# Patient Record
Sex: Female | Born: 1955
Health system: Southern US, Community
[De-identification: ages and names within clinical notes are randomized; demographics above are authoritative.]

## PROBLEM LIST (undated history)

## (undated) DIAGNOSIS — K279 Peptic ulcer, site unspecified, unspecified as acute or chronic, without hemorrhage or perforation: Secondary | ICD-10-CM

## (undated) DIAGNOSIS — I10 Essential (primary) hypertension: Secondary | ICD-10-CM

## (undated) DIAGNOSIS — K760 Fatty (change of) liver, not elsewhere classified: Secondary | ICD-10-CM

## (undated) DIAGNOSIS — C44300 Unspecified malignant neoplasm of skin of unspecified part of face: Secondary | ICD-10-CM

## (undated) DIAGNOSIS — K55069 Acute infarction of intestine, part and extent unspecified: Secondary | ICD-10-CM

## (undated) DIAGNOSIS — I8290 Acute embolism and thrombosis of unspecified vein: Secondary | ICD-10-CM

## (undated) DIAGNOSIS — C55 Malignant neoplasm of uterus, part unspecified: Secondary | ICD-10-CM

## (undated) DIAGNOSIS — I81 Portal vein thrombosis: Secondary | ICD-10-CM

## (undated) DIAGNOSIS — Z780 Asymptomatic menopausal state: Secondary | ICD-10-CM

## (undated) HISTORY — DX: Acute embolism and thrombosis of unspecified vein: I82.90

## (undated) HISTORY — DX: Fatty (change of) liver, not elsewhere classified: K76.0

## (undated) HISTORY — DX: Malignant neoplasm of uterus, part unspecified: C55

## (undated) HISTORY — DX: Peptic ulcer, site unspecified, unspecified as acute or chronic, without hemorrhage or perforation: K27.9

## (undated) HISTORY — DX: Portal vein thrombosis: I81

## (undated) HISTORY — DX: Unspecified malignant neoplasm of skin of unspecified part of face: C44.300

## (undated) HISTORY — DX: Acute infarction of intestine, part and extent unspecified: K55.069

## (undated) HISTORY — DX: Asymptomatic menopausal state: Z78.0

## (undated) HISTORY — PX: APPENDECTOMY: SHX54

---

## 2006-11-06 DIAGNOSIS — I81 Portal vein thrombosis: Secondary | ICD-10-CM

## 2006-11-06 HISTORY — DX: Portal vein thrombosis: I81

## 2006-12-07 HISTORY — PX: OTHER SURGICAL HISTORY: SHX169

## 2007-01-05 DIAGNOSIS — K279 Peptic ulcer, site unspecified, unspecified as acute or chronic, without hemorrhage or perforation: Secondary | ICD-10-CM

## 2007-01-05 HISTORY — DX: Peptic ulcer, site unspecified, unspecified as acute or chronic, without hemorrhage or perforation: K27.9

## 2007-03-01 HISTORY — PX: ESOPHAGOGASTRODUODENOSCOPY: SHX1529

## 2007-11-07 DIAGNOSIS — C44300 Unspecified malignant neoplasm of skin of unspecified part of face: Secondary | ICD-10-CM

## 2007-11-07 HISTORY — DX: Unspecified malignant neoplasm of skin of unspecified part of face: C44.300

## 2008-01-30 ENCOUNTER — Ambulatory Visit: Payer: Self-pay | Admitting: Family Medicine

## 2008-02-03 ENCOUNTER — Encounter: Payer: Self-pay | Admitting: Family Medicine

## 2008-02-24 ENCOUNTER — Encounter: Payer: Self-pay | Admitting: Family Medicine

## 2008-02-24 DIAGNOSIS — Z8711 Personal history of peptic ulcer disease: Secondary | ICD-10-CM | POA: Insufficient documentation

## 2008-02-24 DIAGNOSIS — Z8719 Personal history of other diseases of the digestive system: Secondary | ICD-10-CM

## 2008-02-24 DIAGNOSIS — I82890 Acute embolism and thrombosis of other specified veins: Secondary | ICD-10-CM | POA: Insufficient documentation

## 2008-02-24 DIAGNOSIS — Z86718 Personal history of other venous thrombosis and embolism: Secondary | ICD-10-CM | POA: Insufficient documentation

## 2008-09-03 ENCOUNTER — Ambulatory Visit: Payer: Self-pay | Admitting: Family Medicine

## 2008-09-03 DIAGNOSIS — I10 Essential (primary) hypertension: Secondary | ICD-10-CM | POA: Insufficient documentation

## 2008-09-03 LAB — CONVERTED CEMR LAB
Protein, U semiquant: NEGATIVE
Specific Gravity, Urine: 1.015
Urobilinogen, UA: 0.2

## 2008-09-04 ENCOUNTER — Encounter: Payer: Self-pay | Admitting: Family Medicine

## 2008-09-04 ENCOUNTER — Telehealth: Payer: Self-pay | Admitting: Family Medicine

## 2008-09-17 ENCOUNTER — Encounter: Payer: Self-pay | Admitting: Family Medicine

## 2008-09-17 LAB — CONVERTED CEMR LAB
ALT: 23 units/L (ref 0–35)
Albumin: 4.5 g/dL (ref 3.5–5.2)
Alkaline Phosphatase: 125 units/L — ABNORMAL HIGH (ref 39–117)
CO2: 22 meq/L (ref 19–32)
Glucose, Bld: 107 mg/dL — ABNORMAL HIGH (ref 70–99)
HCT: 50.3 % — ABNORMAL HIGH (ref 36.0–46.0)
Hemoglobin: 16.6 g/dL — ABNORMAL HIGH (ref 12.0–15.0)
MCV: 88.9 fL (ref 78.0–100.0)
Platelets: 419 10*3/uL — ABNORMAL HIGH (ref 150–400)
RBC: 5.66 M/uL — ABNORMAL HIGH (ref 3.87–5.11)
Sodium: 137 meq/L (ref 135–145)
Total Bilirubin: 0.7 mg/dL (ref 0.3–1.2)
Total Protein: 7.9 g/dL (ref 6.0–8.3)
VLDL: 27 mg/dL (ref 0–40)
WBC: 8.3 10*3/uL (ref 4.0–10.5)

## 2008-09-18 ENCOUNTER — Encounter: Payer: Self-pay | Admitting: Family Medicine

## 2008-09-28 ENCOUNTER — Ambulatory Visit: Payer: Self-pay | Admitting: Family Medicine

## 2008-09-28 DIAGNOSIS — E119 Type 2 diabetes mellitus without complications: Secondary | ICD-10-CM | POA: Insufficient documentation

## 2008-09-28 DIAGNOSIS — E785 Hyperlipidemia, unspecified: Secondary | ICD-10-CM | POA: Insufficient documentation

## 2008-10-20 ENCOUNTER — Encounter: Payer: Self-pay | Admitting: Family Medicine

## 2008-10-21 LAB — CONVERTED CEMR LAB
BUN: 12 mg/dL (ref 6–23)
CO2: 21 meq/L (ref 19–32)
Chloride: 103 meq/L (ref 96–112)
Creatinine, Ser: 0.55 mg/dL (ref 0.40–1.20)
Glucose, Bld: 120 mg/dL — ABNORMAL HIGH (ref 70–99)
Potassium: 4.6 meq/L (ref 3.5–5.3)

## 2008-11-06 HISTORY — PX: ABDOMINAL HYSTERECTOMY: SHX81

## 2008-11-16 ENCOUNTER — Telehealth: Payer: Self-pay | Admitting: Family Medicine

## 2008-12-08 ENCOUNTER — Ambulatory Visit: Payer: Self-pay | Admitting: Family Medicine

## 2009-01-26 ENCOUNTER — Ambulatory Visit: Payer: Self-pay | Admitting: Family Medicine

## 2009-01-26 DIAGNOSIS — Z8639 Personal history of other endocrine, nutritional and metabolic disease: Secondary | ICD-10-CM

## 2009-01-26 DIAGNOSIS — Z862 Personal history of diseases of the blood and blood-forming organs and certain disorders involving the immune mechanism: Secondary | ICD-10-CM | POA: Insufficient documentation

## 2009-01-28 ENCOUNTER — Telehealth: Payer: Self-pay | Admitting: Family Medicine

## 2009-01-31 ENCOUNTER — Encounter: Payer: Self-pay | Admitting: Family Medicine

## 2009-02-01 ENCOUNTER — Encounter: Payer: Self-pay | Admitting: Family Medicine

## 2009-02-04 ENCOUNTER — Encounter: Payer: Self-pay | Admitting: Family Medicine

## 2009-02-04 LAB — CONVERTED CEMR LAB
GGT: 82 units/L — ABNORMAL HIGH (ref 7–51)
Glucose, Bld: 101 mg/dL — ABNORMAL HIGH (ref 70–99)
HCT: 45.9 % (ref 36.0–46.0)
HDL: 55 mg/dL (ref >39)
LDL Cholesterol: 116 mg/dL — ABNORMAL HIGH (ref 0–99)
RBC: 5.33 M/uL — ABNORMAL HIGH (ref 3.87–5.11)
VLDL: 20 mg/dL (ref 0–40)
WBC: 7.5 10*3/uL (ref 4.0–10.5)

## 2009-02-08 ENCOUNTER — Telehealth: Payer: Self-pay | Admitting: Family Medicine

## 2009-08-30 ENCOUNTER — Telehealth: Payer: Self-pay | Admitting: Family Medicine

## 2009-08-31 ENCOUNTER — Ambulatory Visit: Payer: Self-pay | Admitting: Family Medicine

## 2009-08-31 DIAGNOSIS — N950 Postmenopausal bleeding: Secondary | ICD-10-CM | POA: Insufficient documentation

## 2009-08-31 LAB — CONVERTED CEMR LAB
Glucose, Urine, Semiquant: NEGATIVE
Nitrite: NEGATIVE
WBC Urine, dipstick: NEGATIVE
pH: 6

## 2009-09-01 ENCOUNTER — Encounter: Payer: Self-pay | Admitting: Family Medicine

## 2009-09-01 LAB — CONVERTED CEMR LAB
Hemoglobin: 16.2 g/dL — ABNORMAL HIGH (ref 12.0–15.0)
Hgb A1c MFr Bld: 6.7 % — ABNORMAL HIGH (ref 4.6–6.1)
Platelets: 389 10*3/uL (ref 150–400)
RBC: 5.47 M/uL — ABNORMAL HIGH (ref 3.87–5.11)
RDW: 14.1 % (ref 11.5–15.5)
WBC, Wet Prep HPF POC: NONE SEEN
WBC: 7.9 10*3/uL (ref 4.0–10.5)
Yeast Wet Prep HPF POC: NONE SEEN

## 2009-11-06 DIAGNOSIS — C55 Malignant neoplasm of uterus, part unspecified: Secondary | ICD-10-CM

## 2009-11-06 HISTORY — DX: Malignant neoplasm of uterus, part unspecified: C55

## 2010-01-04 ENCOUNTER — Ambulatory Visit: Payer: Self-pay | Admitting: Gynecologic Oncology

## 2010-06-16 ENCOUNTER — Telehealth: Payer: Self-pay | Admitting: Family Medicine

## 2010-06-23 ENCOUNTER — Encounter: Payer: Self-pay | Admitting: Family Medicine

## 2010-06-24 LAB — CONVERTED CEMR LAB
AST: 24 units/L (ref 0–37)
BUN: 10 mg/dL (ref 6–23)
CO2: 22 meq/L (ref 19–32)
Cholesterol: 226 mg/dL — ABNORMAL HIGH (ref 0–200)
Glucose, Bld: 100 mg/dL — ABNORMAL HIGH (ref 70–99)
HDL: 48 mg/dL (ref >39)
LDL Cholesterol: 153 mg/dL — ABNORMAL HIGH (ref 0–99)
Potassium: 4.7 meq/L (ref 3.5–5.3)
Sodium: 136 meq/L (ref 135–145)
Total Protein: 7.6 g/dL (ref 6.0–8.3)
VLDL: 25 mg/dL (ref 0–40)

## 2010-06-27 ENCOUNTER — Ambulatory Visit: Payer: Self-pay | Admitting: Family Medicine

## 2010-10-06 ENCOUNTER — Telehealth: Payer: Self-pay | Admitting: Family Medicine

## 2010-11-02 ENCOUNTER — Telehealth: Payer: Self-pay | Admitting: Family Medicine

## 2010-11-02 ENCOUNTER — Encounter
Admission: RE | Admit: 2010-11-02 | Discharge: 2010-11-02 | Payer: Self-pay | Source: Home / Self Care | Attending: Family Medicine | Admitting: Family Medicine

## 2010-12-06 NOTE — Progress Notes (Signed)
Summary: changed statin  Phone Note Call from Patient   Caller: Patient Call For: [place] Bars DO Summary of Call: Pt wanted to let you know that she stopped the cholesterol med because it made her feel funny. Has a followup with you in 6 months. Initial call taken by: Kath[place] LPN,  October 06, 2010 12:00 PM  Follow-up for Phone Call        Since her LDL bad cholesterol was 23 points above goal, I am going to change her Simvastatin to Pravastatin which should be better tolerated.  Take 1 tab at bedtime.  Call if any problems.  Plan to recheck labs 8 wks after new start.   Follow-up by: [place] Bars DO,  October 09, 2010 12:42 PM    New/Updated Medications: PRAVASTATIN SODIUM 20 MG TABS (PRAVASTATIN SODIUM) 1 tab by mouth at bedtime Prescriptions: PRAVASTATIN SODIUM 20 MG TABS (PRAVASTATIN SODIUM) 1 tab by mouth at bedtime  #30 x 1   Entered and Authorized by:   [place] Bars DO   Signed by:   [place] Bars DO on 10/09/2010   Method used:   Electronically to        [facility] [phone]* (retail)       [address] [place], [place]  [address]       Ph: [phone]       Fax: [phone]   RxID:   [id number]   Appended Document: changed statin Pt aware of the above

## 2010-12-06 NOTE — Assessment & Plan Note (Signed)
Summary: CPE w/o pap   Vital Signs:  Patient profile:   55 year old female Menstrual status:  hysterectomy Height:      63 inches Weight:      191 pounds BMI:     33.96 O2 Sat:      96 % on Room air Pulse rate:   93 / minute BP sitting:   124 / 74  (left arm) Cuff size:   large  Vitals Entered By: Payton Spark CMA (June 27, 2010 9:17 AM)  O2 Flow:  Room air CC: CPE w/ out pap     Menstrual Status hysterectomy   Primary Care Provider:  [place] Bars DO  CC:  CPE w/ out pap.  History of Present Illness: 55 yo WF presents for CPE without pap smear.  She reports having a diagnosis of Stage I Uterine Cancer -- had a laparoscopic hysterectomy with oophorectomy in [place] in April.  Doing well.  Was told to f/u with Dr Rito Ehrlich.    She is doing well on BP meds.  Had labs done last wk showing elevated cholesterol.  She had a colonoscopy in 08, due for repeat in 2013. Tdap done 2008.  She is staying active and has a good diet.  She is due for a mammogram and DEXA scan.  She denies fam hx of premature heart dz.    Current Medications (verified): 1)  Benazepril Hcl 10 Mg Tabs (Benazepril Hcl) .Marland Kitchen.. 1 Tab By Mouth Daily  Allergies (verified): No Known Drug Allergies  Past History:  Past Medical History: PUD, EGD 3/08 paps- Dr Rito Ehrlich menopausal since age 55.   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  Past Surgical History: emergency splenectomy 2/08 after spontaneous rupture laparoscopic hysterectomy with oophorectomy for stage I uterine cancer (done in [place]) appy [facility] x 2 EGD 03/01/07- 2 gastric ulcers  GI: Dr Jacinto Reap at [facility]  Family History: Reviewed history from 01/30/2008 and no changes required. mother alive, breast cancer at 25, HTN brother HTN father died at 57,  PUD, blood clots  Social History: Reviewed history from 01/30/2008 and no changes required. Retired Engineer, civil (consulting) from [employer] Married to [employer]. Has 2 grown kids. Never smoked. Denies ETOH. Walks daily. Fair diet.  Review of Systems  The patient denies anorexia, fever, weight loss, weight gain, vision loss, decreased hearing, hoarseness, chest pain, syncope, dyspnea on exertion, peripheral edema, prolonged cough, headaches, hemoptysis, abdominal pain, melena, hematochezia, severe indigestion/heartburn, hematuria, incontinence, genital sores, muscle weakness, suspicious skin lesions, transient blindness, difficulty walking, depression, unusual weight change, abnormal bleeding, enlarged lymph nodes, angioedema, breast masses, and testicular masses.    Physical Exam  General:  alert, well-developed, well-nourished, well-hydrated, and overweight-appearing.   Head:  normocephalic and atraumatic.   Eyes:  PERRLA Ears:  no external deformities.   Nose:  no nasal discharge.   Mouth:  good dentition and pharynx pink and moist.   Neck:  no masses.   Breasts:  No mass, nodules, thickening, tenderness, bulging, retraction, inflamation, nipple discharge or skin changes noted.   Lungs:  Normal respiratory effort, chest expands symmetrically. Lungs are clear to auscultation, no crackles or wheezes. Heart:  Normal rate and regular rhythm. S1 and S2 normal without gallop, murmur, click, rub or other extra sounds. Abdomen:  Bowel sounds positive,abdomen soft and non-tender without masses, organomegaly;  incisional scars well healed. Pulses:  2+ radial and pedal pulses Extremities:  no Le edema Skin:  facial telangiecstasias Cervical Nodes:  No lymphadenopathy noted Psych:  good eye contact, not anxious appearing, and not depressed appearing.     Impression & Recommendations:  Problem # 1:  PHYSICAL EXAMINATION (ICD-V70.0) Keeping healthy checklist for women reviewed. BP at goal on meds, continue. Labs  reviewed from 8-18, cholesterol high.  started Simvastatin 20 mg at bedtime.   Update mammogram and DEXA. Has gyn f/u for newly diagnosed and surgerically removed stage I uterine CA. Tdap done 08. PNX done 08 at time of splenectomy. Colonscopy done 08, due for 5 yr repeat.   Complete Medication List: 1)  Benazepril Hcl 10 Mg Tabs (Benazepril hcl) .Marland Kitchen.. 1 tab by mouth daily 2)  Simvastatin 20 Mg Tabs (Simvastatin) .Marland Kitchen.. 1 tab by mouth at bedtime  Other Orders: T-Mammography Bilateral Screening (16109) T-Dual DXA Bone Density/ Axial (60454) T-DXA Bone Density/ Appendicular (09811)  Patient Instructions: 1)  Start on Simvastatin 20 mg at bedtime for high cholesterol 2)  Check labs in 3 mos. 3)  Update mammogram and DEXA downstairs. 4)  Return for f/u HTN/ cholesterol in 6 mos. Prescriptions: SIMVASTATIN 20 MG TABS (SIMVASTATIN) 1 tab by mouth at bedtime  #30 x 2   Entered and Authorized by:   [place] Bars DO   Signed by:   [place] Bars DO on 06/27/2010   Method used:   Electronically to        [facility] [phone]* (retail)       [address] [place], [place]  [address]       Ph: [phone]       Fax: [phone]   RxID:   [id number]    Tetanus/Td Immunization History:    Tetanus/Td # 1:  Historical (12/07/2006)

## 2010-12-06 NOTE — Progress Notes (Signed)
Summary: Lab order  Phone Note Call from Patient   Caller: Patient Summary of Call: Pt would like to have labs prior to CPE next week. Please order labs and I will fax to lab. Initial call taken by: Payton Spark CMA,  June 16, 2010 11:10 AM  Follow-up for Phone Call        lab order printed.  have drawn 8 hrs fasting. Follow-up by: [place] Bars DO,  June 16, 2010 11:20 AM  New Problems: OTH&UNSPEC ENDOCRN NUTRIT METAB&IMMUNITY D/O (ICD-V77.99) SCREENING FOR LIPOID DISORDERS (ICD-V77.91)   New Problems: OTH&UNSPEC ENDOCRN NUTRIT METAB&IMMUNITY D/O (ICD-V77.99) SCREENING FOR LIPOID DISORDERS (ICD-V77.91)  Appended Document: Lab order Pt aware

## 2010-12-08 NOTE — Progress Notes (Signed)
Summary: Orders  Phone Note Call from Patient Call back at Home Phone [phone]   Caller: Patient Call For: [place] Bars DO Summary of Call: Pt needs order to have her mammogram and bone density done. There is an order in the chart for these but no diagnosis code attached. Need new orders with diagnosis. Fax to [facility] in [place] Initial call taken by: Kathlene November LPN,  November 02, 2010 3:43 PM  Follow-up for Phone Call        We called ad gave code for postmenopause.  Follow-up by: Nani Gasser MD,  November 02, 2010 5:37 PM

## 2011-06-21 ENCOUNTER — Encounter: Payer: Self-pay | Admitting: Family Medicine

## 2011-06-28 ENCOUNTER — Ambulatory Visit (INDEPENDENT_AMBULATORY_CARE_PROVIDER_SITE_OTHER): Payer: BC Managed Care – PPO | Admitting: Family Medicine

## 2011-06-28 ENCOUNTER — Encounter: Payer: Self-pay | Admitting: Family Medicine

## 2011-06-28 DIAGNOSIS — I10 Essential (primary) hypertension: Secondary | ICD-10-CM

## 2011-06-28 DIAGNOSIS — Z13220 Encounter for screening for lipoid disorders: Secondary | ICD-10-CM

## 2011-06-28 MED ORDER — BENAZEPRIL HCL 10 MG PO TABS: 10.0000 mg | ORAL_TABLET | Freq: Every day | ORAL | Status: DC

## 2011-06-28 NOTE — Patient Instructions (Signed)
Stay on lotensin.  RFd today.  Update fasting labs today. Will call you w/ results tomorrow.

## 2011-06-29 ENCOUNTER — Encounter: Payer: Self-pay | Admitting: Family Medicine

## 2011-06-29 ENCOUNTER — Telehealth: Payer: Self-pay | Admitting: Family Medicine

## 2011-06-29 LAB — COMPLETE METABOLIC PANEL WITH GFR
ALT: 31 U/L (ref 0–35)
AST: 23 U/L (ref 0–37)
Albumin: 4.3 g/dL (ref 3.5–5.2)
Alkaline Phosphatase: 90 U/L (ref 39–117)
Chloride: 102 mEq/L (ref 96–112)
Creat: 0.54 mg/dL (ref 0.50–1.10)
Total Bilirubin: 0.5 mg/dL (ref 0.3–1.2)
Total Protein: 7.4 g/dL (ref 6.0–8.3)

## 2011-06-29 LAB — CBC WITH DIFFERENTIAL/PLATELET
Basophils Absolute: 0 10*3/uL (ref 0.0–0.1)
Basophils Relative: 0 % (ref 0–1)
Eosinophils Relative: 3 % (ref 0–5)
Hemoglobin: 15.1 g/dL — ABNORMAL HIGH (ref 12.0–15.0)
Lymphocytes Relative: 30 % (ref 12–46)
MCH: 29.5 pg (ref 26.0–34.0)
MCHC: 32.7 g/dL (ref 30.0–36.0)
Monocytes Relative: 9 % (ref 3–12)
Neutrophils Relative %: 58 % (ref 43–77)

## 2011-06-29 LAB — LIPID PANEL
Cholesterol: 201 mg/dL — ABNORMAL HIGH (ref 0–200)
HDL: 47 mg/dL (ref >39)
Total CHOL/HDL Ratio: 4.3 Ratio

## 2011-06-29 NOTE — Assessment & Plan Note (Signed)
Update fasting labs.  BP at goal.  Continue Lotensin, RFd today.

## 2011-06-29 NOTE — Telephone Encounter (Signed)
Pls let pt know that her cholesterol is a little high with a total of 201, TGs slightly high at 162, LDL at goal at 122 (goal is <130).  Fasting sugar is 108 in the pre diabetic range with normal liver and kidney function and normal blood counts.  Work on [employer], regular exercise.  Repeat in 1 yr.

## 2011-06-29 NOTE — Progress Notes (Signed)
  Subjective:    Patient ID: Rachel Reed, female    DOB: 1956/04/02, 55 y.o.   MRN: [id number]  HPI  55 yo WF here for f/u HTN.  Doing well on lotensin w/o any problems.  Denies CP, DOE, edema, palpitations.  Due for fasting labs and RF.  BP 126/79  Pulse 74  Ht 5\' 3"  (1.6 m)  Wt 194 lb (87.998 kg)  BMI 34.37 kg/m2  SpO2 96%    Review of Systems  Constitutional: Negative for fatigue and unexpected weight change.  Eyes: Negative for visual disturbance.  Respiratory: Negative for shortness of breath.   Cardiovascular: Negative for chest pain, palpitations and leg swelling.  Neurological: Negative for headaches.       Objective:   Physical Exam  Constitutional: She appears well-developed and well-nourished.  HENT:  Mouth/Throat: Oropharynx is clear and moist.  Cardiovascular: Normal rate, regular rhythm and normal heart sounds.   Pulmonary/Chest: Effort normal and breath sounds normal.  Musculoskeletal: She exhibits no edema.  Psychiatric: She has a normal mood and affect.          Assessment & Plan:

## 2011-06-29 NOTE — Telephone Encounter (Signed)
Pt notified of results. KJ LPN 

## 2012-06-21 ENCOUNTER — Ambulatory Visit (INDEPENDENT_AMBULATORY_CARE_PROVIDER_SITE_OTHER): Payer: BC Managed Care – PPO | Admitting: Family Medicine

## 2012-06-21 ENCOUNTER — Encounter: Payer: Self-pay | Admitting: Family Medicine

## 2012-06-21 VITALS — BP 130/83 | HR 68 | Ht 63.0 in | Wt 195.0 lb

## 2012-06-21 DIAGNOSIS — R748 Abnormal levels of other serum enzymes: Secondary | ICD-10-CM

## 2012-06-21 DIAGNOSIS — E785 Hyperlipidemia, unspecified: Secondary | ICD-10-CM

## 2012-06-21 DIAGNOSIS — I10 Essential (primary) hypertension: Secondary | ICD-10-CM

## 2012-06-21 DIAGNOSIS — Z1231 Encounter for screening mammogram for malignant neoplasm of breast: Secondary | ICD-10-CM

## 2012-06-21 MED ORDER — BENAZEPRIL HCL 10 MG PO TABS: 10.0000 mg | ORAL_TABLET | Freq: Every day | ORAL | Status: DC

## 2012-06-21 NOTE — Progress Notes (Signed)
  Subjective:    Patient ID: Rachel Reed, female    DOB: 1956/10/26, 56 y.o.   MRN: [id number]  HPI HTN- No CP or SOB. No SE.  Taking meds regularly.  Eating a low salt diet.  Walks her dog twice a day.    Review of Systems     Objective:   Physical Exam  Constitutional: She is oriented to person, place, and time. She appears well-developed and well-nourished.  HENT:  Head: Normocephalic and atraumatic.  Cardiovascular: Normal rate, regular rhythm and normal heart sounds.        No carotid or abdominal bruits.   Pulmonary/Chest: Effort normal and breath sounds normal.  Neurological: She is alert and oriented to person, place, and time.  Skin: Skin is warm and dry.  Psychiatric: She has a normal mood and affect. Her behavior is normal.          Assessment & Plan:  HTN - Well controlled. Refill meds.  Due for labwork. Due for CMP, lipids. Keep up healthy diet and regular exercise. Followup in 6 months.  Due for mammo. Will place order. Pt prefers ot go every 2 years. She is low risk.    Abnormal liver function - Recheck LFTs.

## 2012-09-13 IMAGING — OT DG DXA BONE DENSITY STUDY HL7
2 series · 2 of 2 positions shown · non-contrast
Comparison: None

CLINICAL DATA: Baseline bone mineral density testing.  The patient
is 54 years although and post menopausal.

[Series 1: — · left · 1 of 1 slices shown (1 of 2)]
[im 1/1]
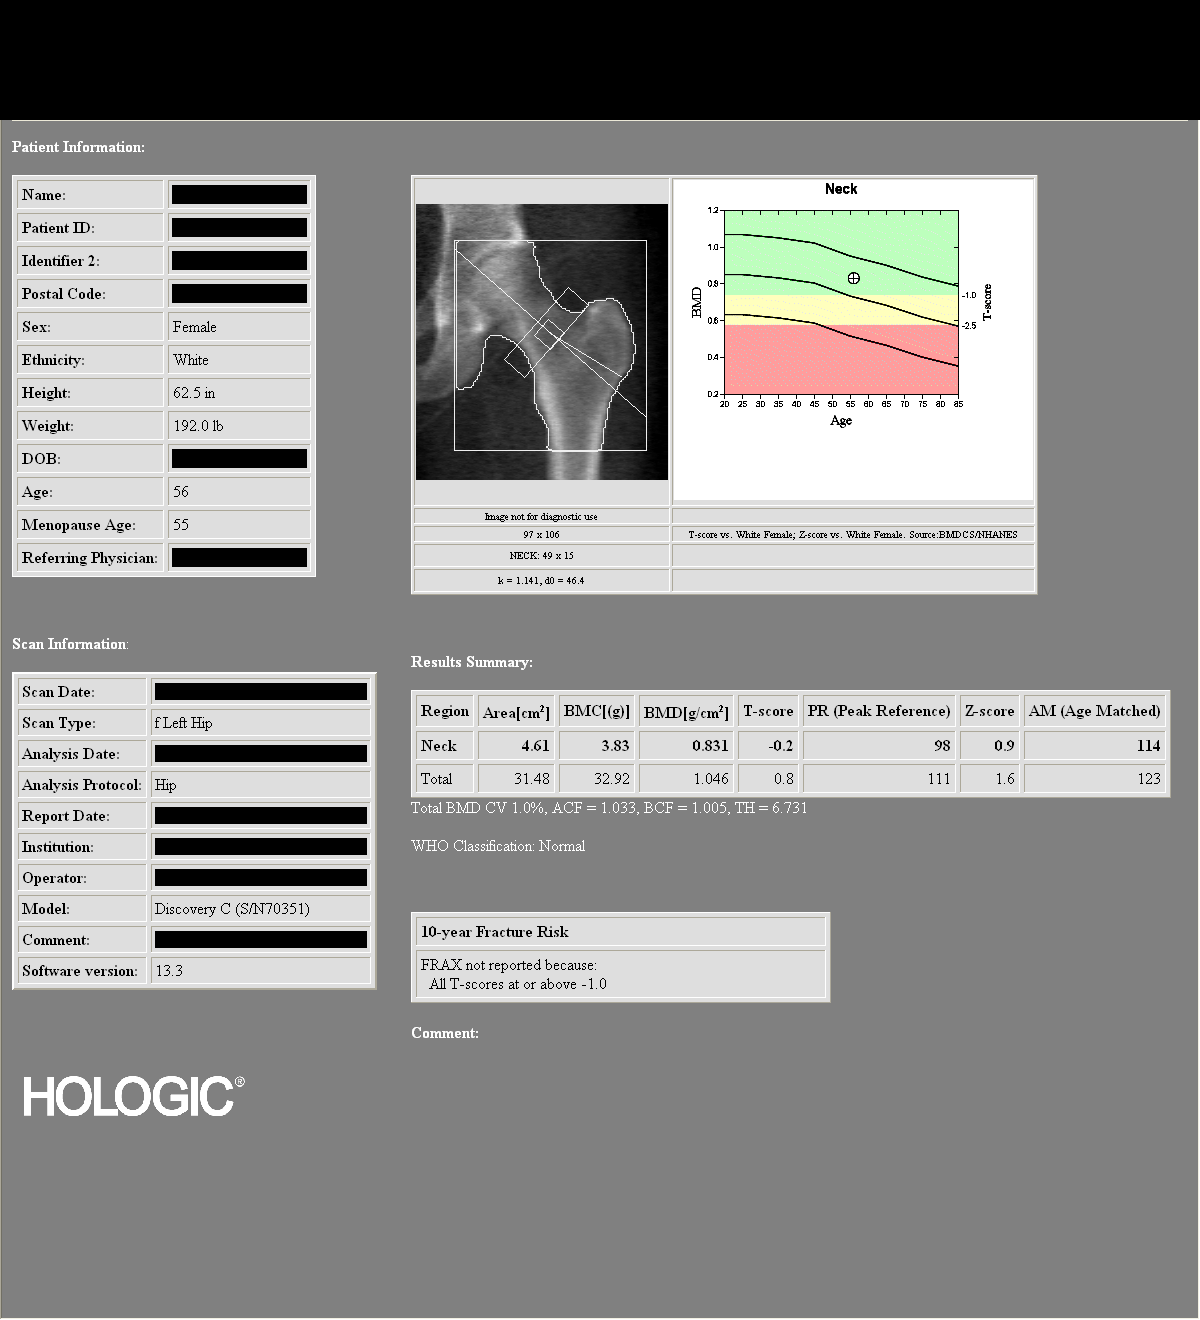

[Series 2: — · 1 of 1 slices shown (2 of 2)]
[im 1/1]
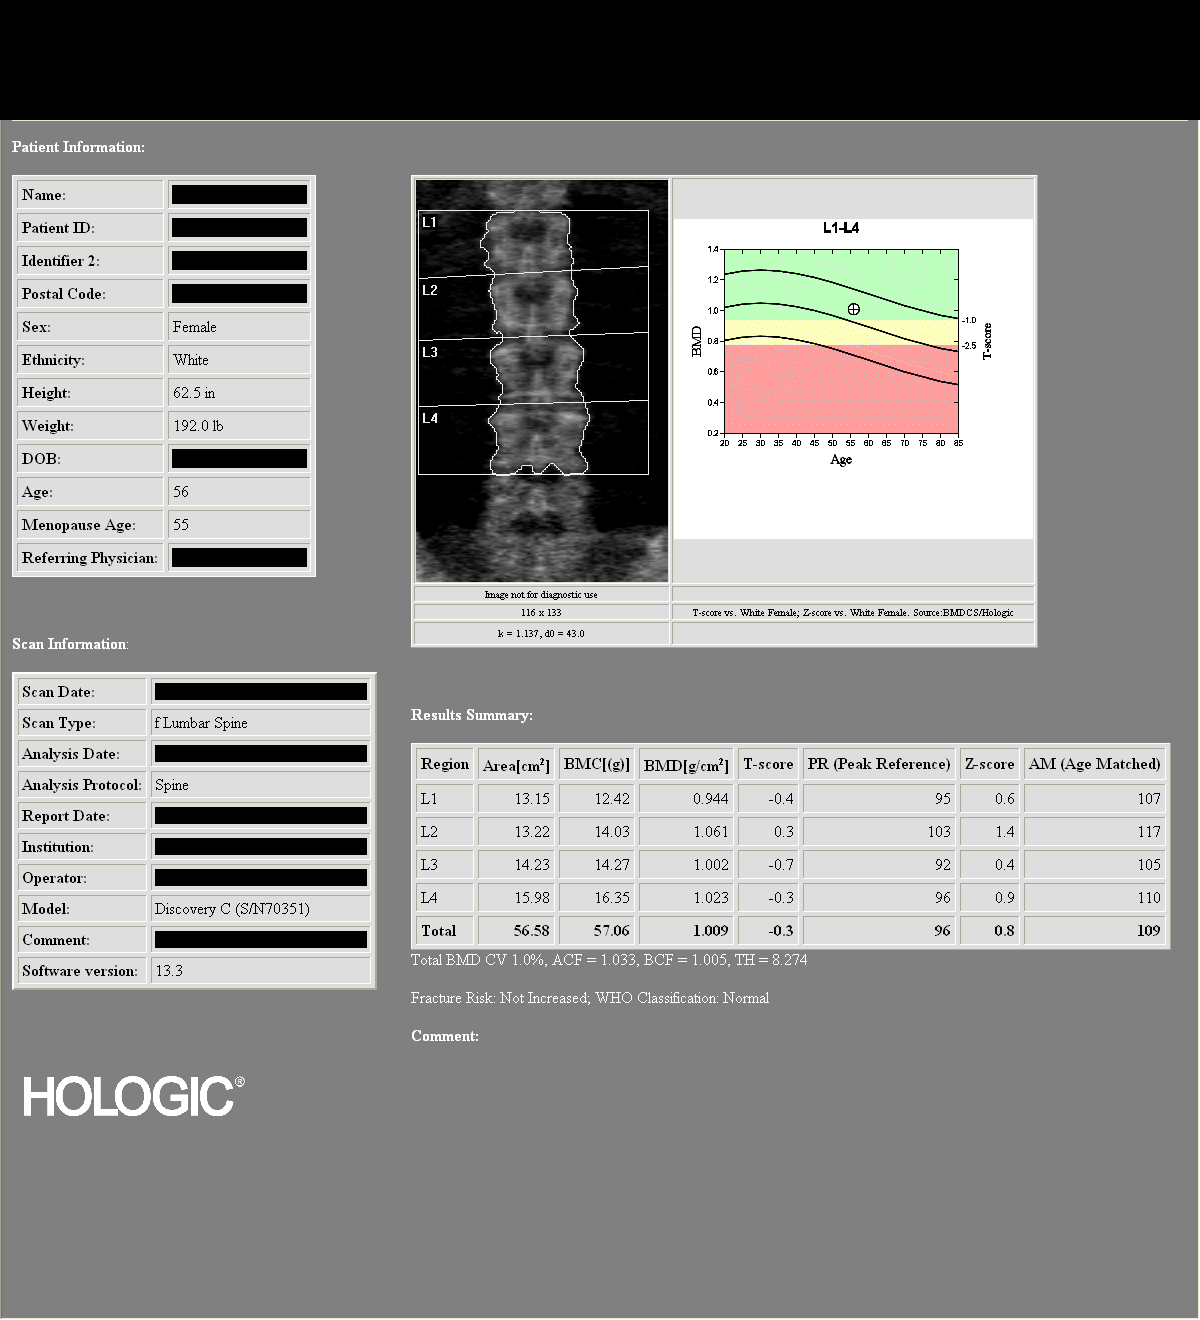

[2 of 2 positions shown; findings below may reference images not displayed]

DUAL X-RAY ABSORPTIOMETRY (DXA) FOR BONE MINERAL DENSITY

AP LUMBAR SPINE L1-L4

Bone Mineral Density (BMD):            1.009 g/cm2
Young Adult T Score:                          -0.3
Z Score:

LEFT FEMUR NECK

Bone Mineral Density (BMD):             0.831 g/cm2
Young Adult T Score:                           -0.2
Z Score:

ASSESSMENT:  Patient's diagnostic category is NORMAL by WHO
Criteria.

FRACTURE RISK: NOT INCREASED
RECOMMENDATIONS:

Effective therapies are available in the form of bisphosphonates,
selective estrogen receptor modulators, biologic agents, and
hormone replacement therapy (for women).  All patients should
ensure an adequate intake of dietary calcium (1200mg daily) and
vitamin D (800 [address]ina Rampersad) unless contraindicated.

All treatment decisions require clinical judgement and
consideration of individual patient factors, including patient
preferences, co-morbidities, previous drug use, risk factors not
captured in the FRAX model (e.g., frailty, falls, vitamin D
deficiency, increased bone turnover, interval significant decline
in bone density) and possible under-or over-estimation of fracture
risk by FRAX.

The National Osteoporosis Foundation recommends that FDA-approved
medical therapies be considered in postmenopausal women and mean
age 50 or older with a:

      1)     Hip or vertebral (clinical or morphometric) fracture.

2)    T-score of -2.5 or lower at the spine or hip.
3)    Ten-year fracture probability by FRAX of 3% or greater for
hip fracture or 20% or greater for major osteoporotic fracture.
FOLLOW-UP:

People with diagnosed cases of osteoporosis or at high risk for
fracture should have regular bone mineral density tests.  For
patients eligible for Medicare, routine testing is allowed once
every 2 years.  The testing frequency can be increased to one year
for patients who have rapidly progressing disease, those who are
receiving or discontinuing medical therapy to restore bone mass, or
have additional risk factors.

World Health Organization (WHO) Criteria:

Normal: T scores from +1.0 to -1.0
Low Bone Mass (Osteopenia): T scores between -1.0 and -2.5
Osteoporosis: T scores -2.5 and below

Comparison to Reference Population:

T score is the key measure used in the diagnosis of osteoporosis
and relative risk determination for fracture.  It provides a value
for bone mass relative to the mean bone mass of a young adult
reference population expressed in terms of standard deviation (SD).

Z score is the age-matched score showing the patient's values
compared to a population matched for age, sex, and race.  This is
also expressed in terms of standard deviation.  The patient may
have values that compare favorably to the age-matched values and
still be at increased risk for fracture.

## 2012-10-09 ENCOUNTER — Encounter: Payer: Self-pay | Admitting: Family Medicine

## 2012-10-09 ENCOUNTER — Telehealth: Payer: Self-pay | Admitting: *Deleted

## 2012-10-09 LAB — COMPLETE METABOLIC PANEL WITH GFR
ALT: 25 U/L (ref 0–35)
AST: 19 U/L (ref 0–37)
Albumin: 4.3 g/dL (ref 3.5–5.2)
Alkaline Phosphatase: 91 U/L (ref 39–117)
BUN: 11 mg/dL (ref 6–23)
CO2: 25 mEq/L (ref 19–32)
Chloride: 102 mEq/L (ref 96–112)
GFR, Est African American: >89 mL/min
GFR, Est Non African American: >89 mL/min

## 2012-10-09 LAB — LIPID PANEL
HDL: 52 mg/dL (ref >39)
LDL Cholesterol: 134 mg/dL — ABNORMAL HIGH (ref 0–99)
Total CHOL/HDL Ratio: 4.3 Ratio

## 2012-10-09 NOTE — Telephone Encounter (Signed)
NOTIFED

## 2012-12-23 ENCOUNTER — Ambulatory Visit: Payer: BC Managed Care – PPO | Admitting: Family Medicine

## 2013-06-23 ENCOUNTER — Ambulatory Visit (INDEPENDENT_AMBULATORY_CARE_PROVIDER_SITE_OTHER): Payer: BC Managed Care – PPO | Admitting: Family Medicine

## 2013-06-23 ENCOUNTER — Encounter: Payer: Self-pay | Admitting: Family Medicine

## 2013-06-23 VITALS — BP 132/82 | HR 78 | Temp 97.5°F | Ht 62.75 in | Wt 190.0 lb

## 2013-06-23 DIAGNOSIS — Z23 Encounter for immunization: Secondary | ICD-10-CM

## 2013-06-23 DIAGNOSIS — E669 Obesity, unspecified: Secondary | ICD-10-CM | POA: Insufficient documentation

## 2013-06-23 DIAGNOSIS — I10 Essential (primary) hypertension: Secondary | ICD-10-CM

## 2013-06-23 DIAGNOSIS — E785 Hyperlipidemia, unspecified: Secondary | ICD-10-CM

## 2013-06-23 DIAGNOSIS — Z0000 Encounter for general adult medical examination without abnormal findings: Secondary | ICD-10-CM

## 2013-06-23 DIAGNOSIS — E2839 Other primary ovarian failure: Secondary | ICD-10-CM

## 2013-06-23 LAB — MICROALBUMIN / CREATININE URINE RATIO: Creatinine,U: 125.3 mg/dL

## 2013-06-23 MED ORDER — BENAZEPRIL HCL 10 MG PO TABS: 10.0000 mg | ORAL_TABLET | Freq: Every day | ORAL | Status: DC

## 2013-06-23 NOTE — Patient Instructions (Addendum)
Preventive Care for Adults, Female A healthy lifestyle and preventive care can promote health and wellness. Preventive health guidelines for women include the following key practices.  A routine yearly physical is a good way to check with your caregiver about your health and preventive screening. It is a chance to share any concerns and updates on your health, and to receive a thorough exam.  Visit your dentist for a routine exam and preventive care every 6 months. Brush your teeth twice a day and floss once a day. Good oral hygiene prevents tooth decay and gum disease.  The frequency of eye exams is based on your age, health, family medical history, use of contact lenses, and other factors. Follow your caregiver's recommendations for frequency of eye exams.  Eat a healthy diet. Foods like vegetables, fruits, whole grains, low-fat dairy products, and lean protein foods contain the nutrients you need without too many calories. Decrease your intake of foods high in solid fats, added sugars, and salt. Eat the right amount of calories for you.Get information about a proper diet from your caregiver, if necessary.  Regular physical exercise is one of the most important things you can do for your health. Most adults should get at least 150 minutes of moderate-intensity exercise (any activity that increases your heart rate and causes you to sweat) each week. In addition, most adults need muscle-strengthening exercises on 2 or more days a week.  Maintain a healthy weight. The body mass index (BMI) is a screening tool to identify possible weight problems. It provides an estimate of body fat based on height and weight. Your caregiver can help determine your BMI, and can help you achieve or maintain a healthy weight.For adults 20 years and older:  A BMI below 18.5 is considered underweight.  A BMI of 18.5 to 24.9 is normal.  A BMI of 25 to 29.9 is considered overweight.  A BMI of 30 and above is  considered obese.  Maintain normal blood lipids and cholesterol levels by exercising and minimizing your intake of saturated fat. Eat a balanced diet with plenty of fruit and vegetables. Blood tests for lipids and cholesterol should begin at age 20 and be repeated every 5 years. If your lipid or cholesterol levels are high, you are over 50, or you are at high risk for heart disease, you may need your cholesterol levels checked more frequently.Ongoing high lipid and cholesterol levels should be treated with medicines if diet and exercise are not effective.  If you smoke, find out from your caregiver how to quit. If you do not use tobacco, do not start.  If you are pregnant, do not drink alcohol. If you are breastfeeding, be very cautious about drinking alcohol. If you are not pregnant and choose to drink alcohol, do not exceed 1 drink per day. One drink is considered to be 12 ounces (355 mL) of beer, 5 ounces (148 mL) of wine, or 1.5 ounces (44 mL) of liquor.  Avoid use of street drugs. Do not share needles with anyone. Ask for help if you need support or instructions about stopping the use of drugs.  High blood pressure causes heart disease and increases the risk of stroke. Your blood pressure should be checked at least every 1 to 2 years. Ongoing high blood pressure should be treated with medicines if weight loss and exercise are not effective.  If you are 55 to 59 years old, ask your caregiver if you should take aspirin to prevent strokes.  Diabetes   screening involves taking a blood sample to check your fasting blood sugar level. This should be done once every 3 years, after age 45, if you are within normal weight and without risk factors for diabetes. Testing should be considered at a younger age or be carried out more frequently if you are overweight and have at least 1 risk factor for diabetes.  Breast cancer screening is essential preventive care for women. You should practice "breast  self-awareness." This means understanding the normal appearance and feel of your breasts and may include breast self-examination. Any changes detected, no matter how small, should be reported to a caregiver. Women in their 20s and 30s should have a clinical breast exam (CBE) by a caregiver as part of a regular health exam every 1 to 3 years. After age 40, women should have a CBE every year. Starting at age 40, women should consider having a mammography (breast X-ray test) every year. Women who have a family history of breast cancer should talk to their caregiver about genetic screening. Women at a high risk of breast cancer should talk to their caregivers about having magnetic resonance imaging (MRI) and a mammography every year.  The Pap test is a screening test for cervical cancer. A Pap test can show cell changes on the cervix that might become cervical cancer if left untreated. A Pap test is a procedure in which cells are obtained and examined from the lower end of the uterus (cervix).  Women should have a Pap test starting at age 21.  Between ages 21 and 29, Pap tests should be repeated every 2 years.  Beginning at age 30, you should have a Pap test every 3 years as long as the past 3 Pap tests have been normal.  Some women have medical problems that increase the chance of getting cervical cancer. Talk to your caregiver about these problems. It is especially important to talk to your caregiver if a new problem develops soon after your last Pap test. In these cases, your caregiver may recommend more frequent screening and Pap tests.  The above recommendations are the same for women who have or have not gotten the vaccine for human papillomavirus (HPV).  If you had a hysterectomy for a problem that was not cancer or a condition that could lead to cancer, then you no longer need Pap tests. Even if you no longer need a Pap test, a regular exam is a good idea to make sure no other problems are  starting.  If you are between ages 65 and 70, and you have had normal Pap tests going back 10 years, you no longer need Pap tests. Even if you no longer need a Pap test, a regular exam is a good idea to make sure no other problems are starting.  If you have had past treatment for cervical cancer or a condition that could lead to cancer, you need Pap tests and screening for cancer for at least 20 years after your treatment.  If Pap tests have been discontinued, risk factors (such as a new sexual partner) need to be reassessed to determine if screening should be resumed.  The HPV test is an additional test that may be used for cervical cancer screening. The HPV test looks for the virus that can cause the cell changes on the cervix. The cells collected during the Pap test can be tested for HPV. The HPV test could be used to screen women aged 30 years and older, and should   be used in women of any age who have unclear Pap test results. After the age of 30, women should have HPV testing at the same frequency as a Pap test.  Colorectal cancer can be detected and often prevented. Most routine colorectal cancer screening begins at the age of 50 and continues through age 75. However, your caregiver may recommend screening at an earlier age if you have risk factors for colon cancer. On a yearly basis, your caregiver may provide home test kits to check for hidden blood in the stool. Use of a small camera at the end of a tube, to directly examine the colon (sigmoidoscopy or colonoscopy), can detect the earliest forms of colorectal cancer. Talk to your caregiver about this at age 50, when routine screening begins. Direct examination of the colon should be repeated every 5 to 10 years through age 75, unless early forms of pre-cancerous polyps or small growths are found.  Hepatitis C blood testing is recommended for all people born from 1945 through 1965 and any individual with known risks for hepatitis C.  Practice  safe sex. Use condoms and avoid high-risk sexual practices to reduce the spread of sexually transmitted infections (STIs). STIs include gonorrhea, chlamydia, syphilis, trichomonas, herpes, HPV, and human immunodeficiency virus (HIV). Herpes, HIV, and HPV are viral illnesses that have no cure. They can result in disability, cancer, and death. Sexually active women aged 25 and younger should be checked for chlamydia. Older women with new or multiple partners should also be tested for chlamydia. Testing for other STIs is recommended if you are sexually active and at increased risk.  Osteoporosis is a disease in which the bones lose minerals and strength with aging. This can result in serious bone fractures. The risk of osteoporosis can be identified using a bone density scan. Women ages 65 and over and women at risk for fractures or osteoporosis should discuss screening with their caregivers. Ask your caregiver whether you should take a calcium supplement or vitamin D to reduce the rate of osteoporosis.  Menopause can be associated with physical symptoms and risks. Hormone replacement therapy is available to decrease symptoms and risks. You should talk to your caregiver about whether hormone replacement therapy is right for you.  Use sunscreen with sun protection factor (SPF) of 30 or more. Apply sunscreen liberally and repeatedly throughout the day. You should seek shade when your shadow is shorter than you. Protect yourself by wearing long sleeves, pants, a wide-brimmed hat, and sunglasses year round, whenever you are outdoors.  Once a month, do a whole body skin exam, using a mirror to look at the skin on your back. Notify your caregiver of new moles, moles that have irregular borders, moles that are larger than a pencil eraser, or moles that have changed in shape or color.  Stay current with required immunizations.  Influenza. You need a dose every fall (or winter). The composition of the flu vaccine  changes each year, so being vaccinated once is not enough.  Pneumococcal polysaccharide. You need 1 to 2 doses if you smoke cigarettes or if you have certain chronic medical conditions. You need 1 dose at age 65 (or older) if you have never been vaccinated.  Tetanus, diphtheria, pertussis (Tdap, Td). Get 1 dose of Tdap vaccine if you are younger than age 65, are over 65 and have contact with an infant, are a healthcare worker, are pregnant, or simply want to be protected from whooping cough. After that, you need a Td   booster dose every 10 years. Consult your caregiver if you have not had at least 3 tetanus and diphtheria-containing shots sometime in your life or have a deep or dirty wound.  HPV. You need this vaccine if you are a woman age 26 or younger. The vaccine is given in 3 doses over 6 months.  Measles, mumps, rubella (MMR). You need at least 1 dose of MMR if you were born in 1957 or later. You may also need a second dose.  Meningococcal. If you are age 19 to 21 and a first-year college student living in a residence hall, or have one of several medical conditions, you need to get vaccinated against meningococcal disease. You may also need additional booster doses.  Zoster (shingles). If you are age 60 or older, you should get this vaccine.  Varicella (chickenpox). If you have never had chickenpox or you were vaccinated but received only 1 dose, talk to your caregiver to find out if you need this vaccine.  Hepatitis A. You need this vaccine if you have a specific risk factor for hepatitis A virus infection or you simply wish to be protected from this disease. The vaccine is usually given as 2 doses, 6 to 18 months apart.  Hepatitis B. You need this vaccine if you have a specific risk factor for hepatitis B virus infection or you simply wish to be protected from this disease. The vaccine is given in 3 doses, usually over 6 months. Preventive Services / Frequency Ages 19 to 39  Blood  pressure check.** / Every 1 to 2 years.  Lipid and cholesterol check.** / Every 5 years beginning at age 20.  Clinical breast exam.** / Every 3 years for women in their 20s and 30s.  Pap test.** / Every 2 years from ages 21 through 29. Every 3 years starting at age 30 through age 65 or 70 with a history of 3 consecutive normal Pap tests.  HPV screening.** / Every 3 years from ages 30 through ages 65 to 70 with a history of 3 consecutive normal Pap tests.  Hepatitis C blood test.** / For any individual with known risks for hepatitis C.  Skin self-exam. / Monthly.  Influenza immunization.** / Every year.  Pneumococcal polysaccharide immunization.** / 1 to 2 doses if you smoke cigarettes or if you have certain chronic medical conditions.  Tetanus, diphtheria, pertussis (Tdap, Td) immunization. / A one-time dose of Tdap vaccine. After that, you need a Td booster dose every 10 years.  HPV immunization. / 3 doses over 6 months, if you are 26 and younger.  Measles, mumps, rubella (MMR) immunization. / You need at least 1 dose of MMR if you were born in 1957 or later. You may also need a second dose.  Meningococcal immunization. / 1 dose if you are age 19 to 21 and a first-year college student living in a residence hall, or have one of several medical conditions, you need to get vaccinated against meningococcal disease. You may also need additional booster doses.  Varicella immunization.** / Consult your caregiver.  Hepatitis A immunization.** / Consult your caregiver. 2 doses, 6 to 18 months apart.  Hepatitis B immunization.** / Consult your caregiver. 3 doses usually over 6 months. Ages 40 to 64  Blood pressure check.** / Every 1 to 2 years.  Lipid and cholesterol check.** / Every 5 years beginning at age 20.  Clinical breast exam.** / Every year after age 40.  Mammogram.** / Every year beginning at age 40   and continuing for as long as you are in good health. Consult with your  caregiver.  Pap test.** / Every 3 years starting at age 30 through age 65 or 70 with a history of 3 consecutive normal Pap tests.  HPV screening.** / Every 3 years from ages 30 through ages 65 to 70 with a history of 3 consecutive normal Pap tests.  Fecal occult blood test (FOBT) of stool. / Every year beginning at age 50 and continuing until age 75. You may not need to do this test if you get a colonoscopy every 10 years.  Flexible sigmoidoscopy or colonoscopy.** / Every 5 years for a flexible sigmoidoscopy or every 10 years for a colonoscopy beginning at age 50 and continuing until age 75.  Hepatitis C blood test.** / For all people born from 1945 through 1965 and any individual with known risks for hepatitis C.  Skin self-exam. / Monthly.  Influenza immunization.** / Every year.  Pneumococcal polysaccharide immunization.** / 1 to 2 doses if you smoke cigarettes or if you have certain chronic medical conditions.  Tetanus, diphtheria, pertussis (Tdap, Td) immunization.** / A one-time dose of Tdap vaccine. After that, you need a Td booster dose every 10 years.  Measles, mumps, rubella (MMR) immunization. / You need at least 1 dose of MMR if you were born in 1957 or later. You may also need a second dose.  Varicella immunization.** / Consult your caregiver.  Meningococcal immunization.** / Consult your caregiver.  Hepatitis A immunization.** / Consult your caregiver. 2 doses, 6 to 18 months apart.  Hepatitis B immunization.** / Consult your caregiver. 3 doses, usually over 6 months. Ages 65 and over  Blood pressure check.** / Every 1 to 2 years.  Lipid and cholesterol check.** / Every 5 years beginning at age 20.  Clinical breast exam.** / Every year after age 40.  Mammogram.** / Every year beginning at age 40 and continuing for as long as you are in good health. Consult with your caregiver.  Pap test.** / Every 3 years starting at age 30 through age 65 or 70 with a 3  consecutive normal Pap tests. Testing can be stopped between 65 and 70 with 3 consecutive normal Pap tests and no abnormal Pap or HPV tests in the past 10 years.  HPV screening.** / Every 3 years from ages 30 through ages 65 or 70 with a history of 3 consecutive normal Pap tests. Testing can be stopped between 65 and 70 with 3 consecutive normal Pap tests and no abnormal Pap or HPV tests in the past 10 years.  Fecal occult blood test (FOBT) of stool. / Every year beginning at age 50 and continuing until age 75. You may not need to do this test if you get a colonoscopy every 10 years.  Flexible sigmoidoscopy or colonoscopy.** / Every 5 years for a flexible sigmoidoscopy or every 10 years for a colonoscopy beginning at age 50 and continuing until age 75.  Hepatitis C blood test.** / For all people born from 1945 through 1965 and any individual with known risks for hepatitis C.  Osteoporosis screening.** / A one-time screening for women ages 65 and over and women at risk for fractures or osteoporosis.  Skin self-exam. / Monthly.  Influenza immunization.** / Every year.  Pneumococcal polysaccharide immunization.** / 1 dose at age 65 (or older) if you have never been vaccinated.  Tetanus, diphtheria, pertussis (Tdap, Td) immunization. / A one-time dose of Tdap vaccine if you are over   65 and have contact with an infant, are a healthcare worker, or simply want to be protected from whooping cough. After that, you need a Td booster dose every 10 years.  Varicella immunization.** / Consult your caregiver.  Meningococcal immunization.** / Consult your caregiver.  Hepatitis A immunization.** / Consult your caregiver. 2 doses, 6 to 18 months apart.  Hepatitis B immunization.** / Check with your caregiver. 3 doses, usually over 6 months. ** Family history and personal history of risk and conditions may change your caregiver's recommendations. Document Released: 12/19/2001 Document Revised: 01/15/2012  Document Reviewed: 03/20/2011 [employer] Patient Information 2014 [employer].  

## 2013-06-23 NOTE — Progress Notes (Signed)
Subjective:     Rachel Reed is a 57 y.o. female and is here for a comprehensive physical exam. The patient reports no problems.  History   Social History  . Marital Status: Married    Spouse Name: N/A    Number of Children: 2  . Years of Education: N/A   Occupational History  . retired Engineer, civil (consulting)    Social History Main Topics  . Smoking status: Never Smoker   . Smokeless tobacco: Not on file  . Alcohol Use: No  . Drug Use: No  . Sexual Activity: Yes    Partners: Male   Other Topics Concern  . Not on file   Social History Narrative   Exercise-- play golf, walk    Health Maintenance  Topic Date Due  . Mammogram  11/02/2012  . Influenza Vaccine  07/07/2013  . Colonoscopy  11/06/2016  . Tetanus/tdap  12/07/2016    The following portions of the patient's history were reviewed and updated as appropriate:  She  has a past medical history of PUD (peptic ulcer disease) (01/2007); Menopause (age 50); Fatty liver; Thrombosis mesenteric vessel; Thrombosis; Cancer (2011); and Skin cancer of face (2009). She  does not have any pertinent problems on file. She  has past surgical history that includes spleenectomy (12/2006); Appendectomy; Cesarean section; Esophagogastroduodenoscopy (03/01/2007); and Abdominal hysterectomy (2010). Her family history includes Breast cancer (age of onset: 61) in her mother; Diabetes in her paternal grandmother and paternal uncle; Hyperlipidemia in her mother; Hypertension in her brother and mother; Pulmonary embolism (age of onset: 46) in her father; Ulcers in her father. She  reports that she has never smoked. She does not have any smokeless tobacco history on file. She reports that she does not drink alcohol or use illicit drugs. She has a current medication list which includes the following prescription(s): benazepril. No current outpatient prescriptions on file prior to visit.   No current facility-administered medications on file prior to visit.   She  has No Known Allergies..  Review of Systems Review of Systems  Constitutional: Negative for activity change, appetite change and fatigue.  HENT: Negative for hearing loss, congestion, tinnitus and ear discharge.  dentist q78m Eyes: Negative for visual disturbance (see optho q1y -- vision corrected to 20/20 with glasses).  Respiratory: Negative for cough, chest tightness and shortness of breath.   Cardiovascular: Negative for chest pain, palpitations and leg swelling.  Gastrointestinal: Negative for abdominal pain, diarrhea, constipation and abdominal distention.  Genitourinary: Negative for urgency, frequency, decreased urine volume and difficulty urinating.  Musculoskeletal: Negative for back pain, arthralgias and gait problem.  Skin: Negative for color change, pallor and rash.  Neurological: Negative for dizziness, light-headedness, numbness and headaches.  Hematological: Negative for adenopathy. Does not bruise/bleed easily.  Psychiatric/Behavioral: Negative for suicidal ideas, confusion, sleep disturbance, self-injury, dysphoric mood, decreased concentration and agitation.       Objective:    BP 132/82  Pulse 78  Temp(Src) 97.5 F (36.4 C) (Oral)  Ht 5' 2.75" (1.594 m)  Wt 190 lb (86.183 kg)  BMI 33.92 kg/m2  SpO2 98% General appearance: alert, cooperative, appears stated age and no distress Head: Normocephalic, without obvious abnormality, atraumatic Eyes: negative findings: lids and lashes normal, conjunctivae and sclerae normal and pupils equal, round, reactive to light and accomodation Ears: normal TM's and external ear canals both ears Nose: Nares normal. Septum midline. Mucosa normal. No drainage or sinus tenderness. Throat: lips, mucosa, and tongue normal; teeth and gums normal Neck: no adenopathy,  no carotid bruit, no JVD, supple, symmetrical, trachea midline and thyroid not enlarged, symmetric, no tenderness/mass/nodules Back: symmetric, no curvature. ROM normal. No  CVA tenderness. Lungs: clear to auscultation bilaterally Breasts: normal appearance, no masses or tenderness Heart: regular rate and rhythm, S1, S2 normal, no murmur, click, rub or gallop Abdomen: soft, non-tender; bowel sounds normal; no masses,  no organomegaly Pelvic: not indicated; status post hysterectomy, negative ROS Extremities: extremities normal, atraumatic, no cyanosis or edema Pulses: 2+ and symmetric Skin: Skin color, texture, turgor normal. No rashes or lesions Lymph nodes: Cervical, supraclavicular, and axillary nodes normal. Neurologic: Alert and oriented X 3, normal strength and tone. Normal symmetric reflexes. Normal coordination and gait Psych-- no anxiety, no depression      Assessment:    Healthy female exam.      Plan:  Check labs  ghm utd See After Visit Summary for Counseling Recommendations

## 2013-06-23 NOTE — Assessment & Plan Note (Signed)
con't meds  Check labs 

## 2013-06-23 NOTE — Assessment & Plan Note (Signed)
Stable Lower at home con't meds

## 2013-06-24 LAB — HEPATIC FUNCTION PANEL
AST: 27 U/L (ref 0–37)
Albumin: 4.2 g/dL (ref 3.5–5.2)
Bilirubin, Direct: 0.1 mg/dL (ref 0.0–0.3)
Total Bilirubin: 0.9 mg/dL (ref 0.3–1.2)
Total Protein: 8.1 g/dL (ref 6.0–8.3)

## 2013-06-24 LAB — BASIC METABOLIC PANEL
BUN: 9 mg/dL (ref 6–23)
CO2: 24 mEq/L (ref 19–32)
Calcium: 9.1 mg/dL (ref 8.4–10.5)
Chloride: 104 mEq/L (ref 96–112)
GFR: 113.22 mL/min (ref >60.00)

## 2013-06-24 LAB — LIPID PANEL
Cholesterol: 222 mg/dL — ABNORMAL HIGH (ref 0–200)
VLDL: 27.4 mg/dL (ref 0.0–40.0)

## 2013-06-24 LAB — CBC WITH DIFFERENTIAL/PLATELET
Eosinophils Relative: 2.2 % (ref 0.0–5.0)
Hemoglobin: 15.7 g/dL — ABNORMAL HIGH (ref 12.0–15.0)
Lymphs Abs: 2.5 10*3/uL (ref 0.7–4.0)
MCV: 88.6 fl (ref 78.0–100.0)
Monocytes Absolute: 0.7 10*3/uL (ref 0.1–1.0)
Monocytes Relative: 8 % (ref 3.0–12.0)
Platelets: 370 10*3/uL (ref 150.0–400.0)
WBC: 8.7 10*3/uL (ref 4.5–10.5)

## 2013-06-24 LAB — LDL CHOLESTEROL, DIRECT: Direct LDL: 162.9 mg/dL

## 2013-06-24 LAB — TSH: TSH: 1.81 u[IU]/mL (ref 0.35–5.50)

## 2013-06-27 ENCOUNTER — Other Ambulatory Visit: Payer: Self-pay

## 2013-06-27 MED ORDER — SIMVASTATIN 20 MG PO TABS: 20.0000 mg | ORAL_TABLET | Freq: Every day | ORAL | Status: DC

## 2013-09-11 ENCOUNTER — Other Ambulatory Visit: Payer: Self-pay

## 2013-09-15 ENCOUNTER — Ambulatory Visit (INDEPENDENT_AMBULATORY_CARE_PROVIDER_SITE_OTHER): Payer: BC Managed Care – PPO | Admitting: Family Medicine

## 2013-09-15 ENCOUNTER — Encounter: Payer: Self-pay | Admitting: Family Medicine

## 2013-09-15 VITALS — BP 132/90 | HR 80 | Temp 98.2°F | Resp 16 | Wt 191.5 lb

## 2013-09-15 DIAGNOSIS — R109 Unspecified abdominal pain: Secondary | ICD-10-CM

## 2013-09-15 DIAGNOSIS — R10A1 Flank pain, right side: Secondary | ICD-10-CM | POA: Insufficient documentation

## 2013-09-15 DIAGNOSIS — M549 Dorsalgia, unspecified: Secondary | ICD-10-CM

## 2013-09-15 LAB — POCT URINALYSIS DIPSTICK
Blood, UA: NEGATIVE
Ketones, UA: NEGATIVE
Leukocytes, UA: NEGATIVE
Spec Grav, UA: <=1.005
Urobilinogen, UA: 0.2
pH, UA: 6.5

## 2013-09-15 MED ORDER — VALACYCLOVIR HCL 1 G PO TABS: 1000.0000 mg | ORAL_TABLET | Freq: Three times a day (TID) | ORAL | Status: DC

## 2013-09-15 MED ORDER — TRAMADOL HCL 50 MG PO TABS: 50.0000 mg | ORAL_TABLET | Freq: Three times a day (TID) | ORAL | Status: DC | PRN

## 2013-09-15 NOTE — Assessment & Plan Note (Signed)
New.  Even in absence of rash, I suspect this is shingles.  No muscle spasm present, (-) SLR bilaterally.  Pain improves w/ movement, worse w/ rest.  Start pain meds, valtrex.  Will follow.

## 2013-09-15 NOTE — Patient Instructions (Signed)
Follow up as needed Start the Valtrex 3x/day for possible shingles- if you're lucky, the rash won't appear! Use the tramadol as needed for pain Heat or ice if this is comfortable Call with any questions or concerns Hang in there!!!

## 2013-09-15 NOTE — Progress Notes (Signed)
  Subjective:    Patient ID: Rachel Reed, female    DOB: 1955/11/20, 57 y.o.   MRN: [id number]  HPI Flank pain- sxs started Thursday, R sided.  Pain worsened Friday- took 1 Aleve w/ some relief.  Saturday had to take additional Aleve.  Yesterday pain was worse and no relief w/ Aleve.  No change w/ eating.  Pain improves w/ activity.  Pain is worse w/ movement.  No N/V/D.  No burning w/ urination, hematuria.  Denies increased stress recently.  Pain does not cross midline.  Pain is elicited w/ light touch, 'my bra hurts'.   Review of Systems For ROS see HPI     Objective:   Physical Exam  Vitals reviewed. Constitutional: She appears well-developed and well-nourished. No distress.  Musculoskeletal: Normal range of motion. She exhibits tenderness (over R dermatomal distribution along lower ribs.  does not cross mid line.  pain w/ light touch of skin.  no pain over muscles). She exhibits no edema.  Neurological:  (-) SLR bilaterally  Skin: Skin is warm and dry. No rash noted.          Assessment & Plan:

## 2013-09-16 ENCOUNTER — Telehealth: Payer: Self-pay | Admitting: Family Medicine

## 2013-09-16 ENCOUNTER — Telehealth: Payer: Self-pay | Admitting: *Deleted

## 2013-09-16 NOTE — Telephone Encounter (Signed)
Patient husband called and stated that patient had allergic reaction to medication that was given to her for her shingles. Patient had symptoms of SOB, numbness, severe itching. Patient did take both of medication at the same time. Patient is currently at hospital. Per patient husband she was given a steroid and benadryl. Would like to know what is the next treatment for her and for the pain? Please advise. SW

## 2013-09-16 NOTE — Telephone Encounter (Signed)
Spoke with pt's husband he understood the advice. Updated pt's allergy list per husband to include the allergies to valtrex and tramadol. Advised pt husband to treat pain with tylenol extra strength and call office if this does not help with the pain.

## 2013-09-16 NOTE — Telephone Encounter (Signed)
Pt husband notified 

## 2013-09-16 NOTE — Telephone Encounter (Signed)
Continue the Valtrex for possible shingles but wait 24 hrs- the reaction is more likely to be from the tramadol (pain meds).  Make sure she has benadryl on hand prior to taking any meds.

## 2013-09-16 NOTE — Telephone Encounter (Signed)
According to the ER notes on pt, she told them that her reaction occurred after taking the 2nd dose of Valtrex.  If this is the case, she should stop the Valtrex.  She can continue the pain meds as needed.  She needs to be clear on which medication caused her symptoms (and it would make sense if it was the 2nd dose- the 1st dose sensitizes the pt and the 2nd dose causes rxn)

## 2013-09-22 ENCOUNTER — Telehealth: Payer: Self-pay | Admitting: *Deleted

## 2013-09-22 NOTE — Telephone Encounter (Signed)
I'm very sorry that pt had a bad reaction to our course of treatment.  With each pt we do the best we can to make a decision based on the history and physical exam.  Pt reported that skin was very tender to touch- even with barely grazing over the area in question.  This skin involvement made it very unlikely to be a muscle strain at the time she presented.  Again, I'm sorry the pt had a reaction.  Will defer any decisions on cost to the management team.

## 2013-09-22 NOTE — Telephone Encounter (Signed)
Spoke with husband who is very upset that his wife was seen by Dr. Beverely Low in our office on 09/15/13 and diagnosed with shingles. Pts husband stated that his wife took 1 dose of the Valtrex and had an anaphylactic reaction and EMS had to be called. Patient was transported to the hospital and was told she did not have shingles and was treated there for a muscle strain. Patients husband is now requesting that fee for ambulance transport and hospital co-pay be reimbursed to them. He also stated "My wife was only in there for 5 minutes, how in the world can she be diagnosed and treated for something that was not visible on her skin?" Patient's husband spoke of seeking legal counsel to recover the fees they had to pay if we did not reimburse him. I advised patient's husband that I would forward the note to my director and the office site manager and that someone would contact him by the end of the week.

## 2013-09-26 ENCOUNTER — Telehealth: Payer: Self-pay | Admitting: *Deleted

## 2013-09-26 NOTE — Telephone Encounter (Signed)
Spoke with pt and advised that this situation was reviewed by our directors and it was determined that we would not reimburse them for the cost they incurred for the EMS transport and hospital co-pay. Patients husband then came to the phone upset and stated that their attorney would be contacting us and hung up.

## 2013-09-30 ENCOUNTER — Encounter: Payer: Self-pay | Admitting: Family Medicine

## 2014-06-15 ENCOUNTER — Ambulatory Visit (INDEPENDENT_AMBULATORY_CARE_PROVIDER_SITE_OTHER): Payer: BC Managed Care – PPO | Admitting: Family Medicine

## 2014-06-15 ENCOUNTER — Encounter: Payer: Self-pay | Admitting: Family Medicine

## 2014-06-15 VITALS — BP 127/72 | HR 74 | Ht 63.0 in | Wt 191.0 lb

## 2014-06-15 DIAGNOSIS — E785 Hyperlipidemia, unspecified: Secondary | ICD-10-CM

## 2014-06-15 DIAGNOSIS — Z9089 Acquired absence of other organs: Secondary | ICD-10-CM

## 2014-06-15 DIAGNOSIS — I10 Essential (primary) hypertension: Secondary | ICD-10-CM

## 2014-06-15 DIAGNOSIS — Z9081 Acquired absence of spleen: Secondary | ICD-10-CM | POA: Insufficient documentation

## 2014-06-15 DIAGNOSIS — Z1231 Encounter for screening mammogram for malignant neoplasm of breast: Secondary | ICD-10-CM | POA: Diagnosis not present

## 2014-06-15 NOTE — Patient Instructions (Signed)
Flu shot are available next month.

## 2014-06-15 NOTE — Progress Notes (Signed)
   Subjective:    Patient ID: Rachel Reed, female    DOB: 12-20-1955, 58 y.o.   MRN: [id number]  HPI  Hypertension- Pt denies chest pain, SOB, dizziness, or heart palpitations.  Taking meds as directed w/o problems.  Denies medication side effects.  She's taking benazepril 10 mg daily.  Dyslipidemia -she feels like she is intolerant to statins. Reports that she has tried several different statins in the past and always seems to give her a little pain.  Review of Systems     Objective:   Physical Exam  Constitutional: She is oriented to person, place, and time. She appears well-developed and well-nourished.  HENT:  Head: Normocephalic and atraumatic.  Cardiovascular: Normal rate, regular rhythm and normal heart sounds.   Pulmonary/Chest: Effort normal and breath sounds normal.  Neurological: She is alert and oriented to person, place, and time.  Skin: Skin is warm and dry.  Psychiatric: She has a normal mood and affect. Her behavior is normal.          Assessment & Plan:  HTN - well controlled. F/U in 6 mo.    Dyslipidemia - due to rehceck lipoids.  Added to intolerance list.    Impaired fasting glucose-we'll check an A1c at her next visit.  Discussed the importance of yearly flu vaccines will be available next month.   overdue for screening mammogram. Order placed today.

## 2014-06-16 LAB — COMPLETE METABOLIC PANEL WITH GFR
ALT: 26 U/L (ref 0–35)
AST: 20 U/L (ref 0–37)
Albumin: 4.3 g/dL (ref 3.5–5.2)
Alkaline Phosphatase: 85 U/L (ref 39–117)
BUN: 10 mg/dL (ref 6–23)
CO2: 24 mEq/L (ref 19–32)
CREATININE: 0.52 mg/dL (ref 0.50–1.10)
Calcium: 9.1 mg/dL (ref 8.4–10.5)
Chloride: 103 mEq/L (ref 96–112)
GFR, Est African American: >89 mL/min
GFR, Est Non African American: >89 mL/min
GLUCOSE: 119 mg/dL — AB (ref 70–99)
POTASSIUM: 4.5 meq/L (ref 3.5–5.3)
SODIUM: 136 meq/L (ref 135–145)
Total Bilirubin: 0.8 mg/dL (ref 0.2–1.2)
Total Protein: 7.2 g/dL (ref 6.0–8.3)

## 2014-06-16 LAB — LIPID PANEL
CHOL/HDL RATIO: 4.1 ratio
CHOLESTEROL: 206 mg/dL — AB (ref 0–200)
HDL: 50 mg/dL (ref >39)
LDL CALC: 128 mg/dL — AB (ref 0–99)
TRIGLYCERIDES: 138 mg/dL (ref <150)
VLDL: 28 mg/dL (ref 0–40)

## 2014-06-18 ENCOUNTER — Other Ambulatory Visit: Payer: Self-pay | Admitting: Family Medicine

## 2014-06-24 ENCOUNTER — Other Ambulatory Visit: Payer: Self-pay | Admitting: *Deleted

## 2014-06-24 ENCOUNTER — Other Ambulatory Visit: Payer: Self-pay | Admitting: Family Medicine

## 2014-06-24 DIAGNOSIS — I10 Essential (primary) hypertension: Secondary | ICD-10-CM

## 2014-06-24 MED ORDER — BENAZEPRIL HCL 10 MG PO TABS: 10.0000 mg | ORAL_TABLET | Freq: Every day | ORAL | Status: DC

## 2014-12-14 ENCOUNTER — Ambulatory Visit: Payer: BC Managed Care – PPO | Admitting: Family Medicine

## 2015-01-05 ENCOUNTER — Other Ambulatory Visit: Payer: Self-pay | Admitting: Family Medicine

## 2015-07-23 ENCOUNTER — Emergency Department (INDEPENDENT_AMBULATORY_CARE_PROVIDER_SITE_OTHER)
Admission: EM | Admit: 2015-07-23 | Discharge: 2015-07-23 | Disposition: A | Discharge status: Home / Self Care | Payer: BLUE CROSS/BLUE SHIELD | Source: Home / Self Care | Attending: Family Medicine | Admitting: Family Medicine

## 2015-07-23 DIAGNOSIS — N3090 Cystitis, unspecified without hematuria: Secondary | ICD-10-CM

## 2015-07-23 HISTORY — DX: Essential (primary) hypertension: I10

## 2015-07-23 LAB — POCT URINALYSIS DIP (MANUAL ENTRY)
Bilirubin, UA: NEGATIVE
Glucose, UA: NEGATIVE
Ketones, POC UA: NEGATIVE
Leukocytes, UA: NEGATIVE
NITRITE UA: NEGATIVE
PH UA: 7 (ref 5–8)
Protein Ur, POC: NEGATIVE
RBC UA: NEGATIVE
SPEC GRAV UA: 1.01 (ref 1.005–1.03)
Urobilinogen, UA: 0.2 (ref 0–1)

## 2015-07-23 MED ORDER — CEPHALEXIN 500 MG PO CAPS: 500.0000 mg | ORAL_CAPSULE | Freq: Two times a day (BID) | ORAL | Status: DC

## 2015-07-23 MED ORDER — PHENAZOPYRIDINE HCL 200 MG PO TABS: 200.0000 mg | ORAL_TABLET | Freq: Three times a day (TID) | ORAL | Status: DC

## 2015-07-23 NOTE — ED Provider Notes (Signed)
CSN: [id number]     Arrival date & time 07/23/15  1233 History   First MD Initiated Contact with Patient 07/23/15 1333     Chief Complaint  Patient presents with  . Urinary Frequency      HPI Comments: Patient complains of two day history of dysuria, urgency, nocturia, and hesitancy.  No abdominal pain, nausea/vomiting, or fever.  Patient is a 59 y.o. female presenting with dysuria. The history is provided by the patient.  Dysuria Pain quality:  Burning Pain severity:  Mild Onset quality:  Gradual Duration:  2 days Timing:  Constant Progression:  Worsening Chronicity:  New Recent urinary tract infections: no   Relieved by:  None tried Worsened by:  Nothing tried Ineffective treatments:  None tried Urinary symptoms: frequent urination and hesitancy   Urinary symptoms: no discolored urine, no foul-smelling urine, no hematuria and no bladder incontinence   Associated symptoms: no abdominal pain, no fever, no flank pain, no genital lesions, no nausea and no vomiting     Past Medical History  Diagnosis Date  . PUD (peptic ulcer disease) 01/2007  . Menopause age 45  . Fatty liver   . Thrombosis mesenteric vessel     inferior and superior   . Thrombosis     portal vein- 5 weeks post spleenectomy  . Cancer 2011    Stage 1 uterine  . Skin cancer of face 2009    BCC  . Hypertension    Past Surgical History  Procedure Laterality Date  . Spleenectomy  12/2006    spontaneous rupture  . Appendectomy    . Cesarean section      x2  . Esophagogastroduodenoscopy  03/01/2007    2 gastric ulcers  . Abdominal hysterectomy  2010    laparoscopic w/ oophorectomy for stage 1 uterine cancer   Family History  Problem Relation Age of Onset  . Breast cancer Mother 81    breast  . Hypertension Brother   . Ulcers Father   . Pulmonary embolism Father 59    deceased  . Hyperlipidemia Mother   . Hypertension Mother   . Diabetes Paternal Grandmother   . Diabetes Paternal Uncle    Social  History  Substance Use Topics  . Smoking status: Never Smoker   . Smokeless tobacco: Never Used  . Alcohol Use: No   OB History    No data available     Review of Systems  Constitutional: Negative for fever.  Gastrointestinal: Negative for nausea, vomiting and abdominal pain.  Genitourinary: Positive for dysuria. Negative for flank pain.  All other systems reviewed and are negative.   Allergies  Tramadol; Valtrex; Aspirin; and Statins  Home Medications   Prior to Admission medications   Medication Sig Start Date End Date Taking? Authorizing Provider  benazepril (LOTENSIN) 10 MG tablet TAKE ONE TABLET BY MOUTH ONCE DAILY 01/05/15   Hali Marry, MD  cephALEXin (KEFLEX) 500 MG capsule Take 1 capsule (500 mg total) by mouth 2 (two) times daily. 07/23/15   Kandra Nicolas, MD  phenazopyridine (PYRIDIUM) 200 MG tablet Take 1 tablet (200 mg total) by mouth 3 (three) times daily. Take after meals. 07/23/15   Kandra Nicolas, MD   Meds Ordered and Administered this Visit  Medications - No data to display  BP 136/84 mmHg  Pulse 72  Temp(Src) 97.9 F (36.6 C) (Oral)  Ht 5\' 3"  (1.6 m)  Wt 194 lb 12 oz (88.338 kg)  BMI 34.51 kg/m2  SpO2  93% No data found.   Physical Exam Nursing notes and Vital Signs reviewed. Appearance:  Patient appears stated age, and in no acute distress.  Patient is obese (BMI 34.5) Eyes:  Pupils are equal, round, and reactive to light and accomodation.  Extraocular movement is intact.  Conjunctivae are not inflamed  Nose:  Normal  Pharynx:  Normal Neck:  Supple.  No adenopathy Lungs:  Clear to auscultation.  Breath sounds are equal.  Moving air well. Heart:  Regular rate and rhythm without murmurs, rubs, or gallops.  Abdomen:  Nontender without masses or hepatosplenomegaly.  Bowel sounds are present.  No CVA or flank tenderness.  Extremities:  No edema.  No calf tenderness Skin:  No rash present.   ED Course  Procedures  None    Labs Reviewed   POCT URINALYSIS DIP (MANUAL ENTRY) - Abnormal; Notable for the following:    Color, UA light yellow (*)    All other components within normal limits  URINE CULTURE      MDM   1. Cystitis    Urine culture pending. Begin Keflex 500mg  BID, and Pyridium Increase fluid intake. If symptoms become significantly worse during the night or over the weekend, proceed to the local emergency room.  Followup with Family Doctor if not improved in one week.     Kandra Nicolas, MD 07/29/15 [phone]

## 2015-07-23 NOTE — Discharge Instructions (Signed)
Increase fluid intake. °If symptoms become significantly worse during the night or over the weekend, proceed to the local emergency room.  ° ° °Urinary Tract Infection °Urinary tract infections (UTIs) can develop anywhere along your urinary tract. Your urinary tract is your body's drainage system for removing wastes and extra water. Your urinary tract includes two kidneys, two ureters, a bladder, and a urethra. Your kidneys are a pair of bean-shaped organs. Each kidney is about the size of your fist. They are located below your ribs, one on each side of your spine. °CAUSES °Infections are caused by microbes, which are microscopic organisms, including fungi, viruses, and bacteria. These organisms are so small that they can only be seen through a microscope. Bacteria are the microbes that most commonly cause UTIs. °SYMPTOMS  °Symptoms of UTIs may vary by age and gender of the patient and by the location of the infection. Symptoms in young women typically include a frequent and intense urge to urinate and a painful, burning feeling in the bladder or urethra during urination. Older women and men are more likely to be tired, shaky, and weak and have muscle aches and abdominal pain. A fever may mean the infection is in your kidneys. Other symptoms of a kidney infection include pain in your back or sides below the ribs, nausea, and vomiting. °DIAGNOSIS °To diagnose a UTI, your caregiver will ask you about your symptoms. Your caregiver also will ask to provide a urine sample. The urine sample will be tested for bacteria and white blood cells. White blood cells are made by your body to help fight infection. °TREATMENT  °Typically, UTIs can be treated with medication. Because most UTIs are caused by a bacterial infection, they usually can be treated with the use of antibiotics. The choice of antibiotic and length of treatment depend on your symptoms and the type of bacteria causing your infection. °HOME CARE  INSTRUCTIONS °· If you were prescribed antibiotics, take them exactly as your caregiver instructs you. Finish the medication even if you feel better after you have only taken some of the medication. °· Drink enough water and fluids to keep your urine clear or pale yellow. °· Avoid caffeine, tea, and carbonated beverages. They tend to irritate your bladder. °· Empty your bladder often. Avoid holding urine for long periods of time. °· Empty your bladder before and after sexual intercourse. °· After a bowel movement, women should cleanse from front to back. Use each tissue only once. °SEEK MEDICAL CARE IF:  °· You have back pain. °· You develop a fever. °· Your symptoms do not begin to resolve within 3 days. °SEEK IMMEDIATE MEDICAL CARE IF:  °· You have severe back pain or lower abdominal pain. °· You develop chills. °· You have nausea or vomiting. °· You have continued burning or discomfort with urination. °MAKE SURE YOU:  °· Understand these instructions. °· Will watch your condition. °· Will get help right away if you are not doing well or get worse. °Document Released: 08/02/2005 Document Revised: 04/23/2012 Document Reviewed: 12/01/2011 °ExitCare® Patient Information ©2015 [employer]. This information is not intended to replace advice given to you by your health care provider. Make sure you discuss any questions you have with your health care provider. ° °

## 2015-07-23 NOTE — ED Notes (Signed)
Started 2 days ago with burning continuously and pressure.  Remaining constant without becoming worse or better. Notices back pain mostly at night

## 2015-07-25 LAB — URINE CULTURE: Colony Count: 50000

## 2015-07-26 ENCOUNTER — Telehealth: Payer: Self-pay | Admitting: Emergency Medicine

## 2017-01-30 ENCOUNTER — Ambulatory Visit (INDEPENDENT_AMBULATORY_CARE_PROVIDER_SITE_OTHER): Payer: BLUE CROSS/BLUE SHIELD | Admitting: Family Medicine

## 2017-01-30 ENCOUNTER — Encounter: Payer: Self-pay | Admitting: Family Medicine

## 2017-01-30 VITALS — BP 121/73 | HR 82 | Ht 63.0 in | Wt 192.0 lb

## 2017-01-30 DIAGNOSIS — Z8719 Personal history of other diseases of the digestive system: Secondary | ICD-10-CM

## 2017-01-30 DIAGNOSIS — Z1231 Encounter for screening mammogram for malignant neoplasm of breast: Secondary | ICD-10-CM

## 2017-01-30 DIAGNOSIS — E785 Hyperlipidemia, unspecified: Secondary | ICD-10-CM | POA: Diagnosis not present

## 2017-01-30 DIAGNOSIS — I10 Essential (primary) hypertension: Secondary | ICD-10-CM

## 2017-01-30 DIAGNOSIS — Z23 Encounter for immunization: Secondary | ICD-10-CM | POA: Diagnosis not present

## 2017-01-30 DIAGNOSIS — Z8711 Personal history of peptic ulcer disease: Secondary | ICD-10-CM

## 2017-01-30 DIAGNOSIS — Z86718 Personal history of other venous thrombosis and embolism: Secondary | ICD-10-CM

## 2017-01-30 MED ORDER — BENAZEPRIL HCL 10 MG PO TABS: 10.0000 mg | ORAL_TABLET | Freq: Every day | ORAL | 3 refills | Status: DC

## 2017-01-30 NOTE — Progress Notes (Signed)
Subjective:    CC: HTN, not seen since 2015  HPI: 61 year female comes back to reestablish care. She was going to physician [place] for the last couple of years because it was more convenient since her mother was going there as well. That provider evidently left the practice to work at the [place] and so she would like to reestablish care here.  Hypertension- Pt denies chest pain, SOB, dizziness, or heart palpitations.  Taking meds as directed w/o problems.  Denies medication side effects.   Dislipidemia-not currently on medication. BMI is 34 which is exactly what it was 2 years ago.   Past medical history, Surgical history, Family history not pertinant except as noted below, Social history, Allergies, and medications have been entered into the medical record, reviewed, and corrections made.   Review of Systems: No fevers, chills, night sweats, weight loss, chest pain, or shortness of breath.   Objective:    General: Well Developed, well nourished, and in no acute distress.  Neuro: Alert and oriented x3, extra-ocular muscles intact, sensation grossly intact.  HEENT: Normocephalic, atraumatic  Skin: Warm and dry, no rashes. Cardiac: Regular rate and rhythm, no murmurs rubs or gallops, no lower extremity edema.  Respiratory: Clear to auscultation bilaterally. Not using accessory muscles, speaking in full sentences.   Impression and Recommendations:    Hyperetension-well-controlled. Continue current regimen. She needs refill today. Due for CMP, lipid, and CBC.   Dyslipidemia-due to recheck lipid panel. Will call with results once available.  History of splenectomy-check CBC.  She is due for mammogram. Order placed. Last one was in 2011 but she says she is ready to do it. Prior history of hysterectomy so no longer needs Pap smears.  Colon cancer screening discussed. She does not want to do a colonoscopy but is willing to consider: Card. She will call her insurance to see if it's  covered. Gave her some additional information and handouts today. If not covered then we can irregular stool cards.  Tdap Given today.

## 2017-01-31 LAB — COMPLETE METABOLIC PANEL WITH GFR
ALK PHOS: 90 U/L (ref 33–130)
ALT: 20 U/L (ref 6–29)
AST: 17 U/L (ref 10–35)
Albumin: 4.3 g/dL (ref 3.6–5.1)
BUN: 12 mg/dL (ref 7–25)
CO2: 29 mmol/L (ref 20–31)
Calcium: 9.5 mg/dL (ref 8.6–10.4)
Chloride: 101 mmol/L (ref 98–110)
Creat: 0.69 mg/dL (ref 0.50–0.99)
Glucose, Bld: 122 mg/dL — ABNORMAL HIGH (ref 65–99)
Potassium: 5 mmol/L (ref 3.5–5.3)
Sodium: 135 mmol/L (ref 135–146)
TOTAL PROTEIN: 7.7 g/dL (ref 6.1–8.1)
Total Bilirubin: 0.6 mg/dL (ref 0.2–1.2)

## 2017-01-31 LAB — CBC
HCT: 47.2 % — ABNORMAL HIGH (ref 35.0–45.0)
Hemoglobin: 16.1 g/dL — ABNORMAL HIGH (ref 11.7–15.5)
MCH: 30.2 pg (ref 27.0–33.0)
MCHC: 34.1 g/dL (ref 32.0–36.0)
MCV: 88.6 fL (ref 80.0–100.0)
MPV: 11.7 fL (ref 7.5–12.5)
Platelets: 359 10*3/uL (ref 140–400)
RBC: 5.33 MIL/uL — AB (ref 3.80–5.10)
RDW: 14.2 % (ref 11.0–15.0)
WBC: 8.1 10*3/uL (ref 3.8–10.8)

## 2017-01-31 LAB — LIPID PANEL W/REFLEX DIRECT LDL
Cholesterol: 217 mg/dL — ABNORMAL HIGH (ref <200)
HDL: 57 mg/dL (ref >50)
LDL-CHOLESTEROL: 132 mg/dL — AB
NON-HDL CHOLESTEROL (CALC): 160 mg/dL — AB (ref <130)
TRIGLYCERIDES: 151 mg/dL — AB (ref <150)
Total CHOL/HDL Ratio: 3.8 Ratio (ref <5.0)

## 2017-07-19 ENCOUNTER — Ambulatory Visit (INDEPENDENT_AMBULATORY_CARE_PROVIDER_SITE_OTHER): Payer: BLUE CROSS/BLUE SHIELD | Admitting: Family Medicine

## 2017-07-19 ENCOUNTER — Encounter: Payer: Self-pay | Admitting: Family Medicine

## 2017-07-19 VITALS — BP 134/71 | HR 77 | Ht 63.0 in | Wt 194.0 lb

## 2017-07-19 DIAGNOSIS — R7301 Impaired fasting glucose: Secondary | ICD-10-CM | POA: Diagnosis not present

## 2017-07-19 DIAGNOSIS — I10 Essential (primary) hypertension: Secondary | ICD-10-CM | POA: Diagnosis not present

## 2017-07-19 DIAGNOSIS — E119 Type 2 diabetes mellitus without complications: Secondary | ICD-10-CM | POA: Diagnosis not present

## 2017-07-19 LAB — POCT GLYCOSYLATED HEMOGLOBIN (HGB A1C): Hemoglobin A1C: 7.4

## 2017-07-19 MED ORDER — AMBULATORY NON FORMULARY MEDICATION: Status: AC

## 2017-07-19 MED ORDER — BENAZEPRIL HCL 10 MG PO TABS: 10.0000 mg | ORAL_TABLET | Freq: Every day | ORAL | 3 refills | Status: AC

## 2017-07-19 MED ORDER — METFORMIN HCL ER 500 MG PO TB24: 500.0000 mg | ORAL_TABLET | Freq: Every day | ORAL | 1 refills | Status: DC

## 2017-07-19 MED ORDER — BENAZEPRIL HCL 10 MG PO TABS: 10.0000 mg | ORAL_TABLET | Freq: Every day | ORAL | 3 refills | Status: DC

## 2017-07-19 MED ORDER — METFORMIN HCL ER 500 MG PO TB24: 500.0000 mg | ORAL_TABLET | Freq: Every day | ORAL | 1 refills | Status: AC

## 2017-07-19 NOTE — Patient Instructions (Addendum)
Stop drinking sweet tea. Start working on your regular walking routine. Check her sugar 3 days per week. Checked first thing in the morning before breakfast. Please bring in your blood sugar log with you at your next office visit. We will work on trying to get you in for the diabetes and nutrition classes.   Type 2 Diabetes Mellitus, Diagnosis, Adult Type 2 diabetes (type 2 diabetes mellitus) is a long-term (chronic) disease. It may be caused by one or both of these problems:  Your body does not make enough of a hormone called insulin.  Your body does not react in a normal way to insulin that it makes.  Insulin lets sugars (glucose) go into cells in the body. This gives you energy. If you have type 2 diabetes, sugars cannot get into cells. This causes high blood sugar (hyperglycemia). Your doctor will set treatment goals for you. Generally, you should have these blood sugar levels:  Before meals (preprandial): 80-130 mg/dL (4.4-7.2 mmol/L).  After meals (postprandial): below 180 mg/dL (10 mmol/L).  A1c (hemoglobin A1c) level: less than 7%.  Follow these instructions at home: Questions to Ask Your Doctor  You may want to ask these questions:  Do I need to meet with a diabetes educator?  Where can I find a support group for people with diabetes?  What equipment will I need to care for myself at home?  What diabetes medicines do I need? When should I take them?  How often do I need to check my blood sugar?  What number can I call if I have questions?  When is my next doctor's visit?  General instructions  Take over-the-counter and prescription medicines only as told by your doctor.  Keep all follow-up visits as told by your doctor. This is important. Contact a doctor if:  Your blood sugar is at or above 240 mg/dL (13.3 mmol/L) for 2 days in a row.  You have been sick or have had a fever for 2 days or more and you are not getting better.  You have any of these problems  for more than 6 hours: ? You cannot eat or drink. ? You feel sick to your stomach (nauseous). ? You throw up (vomit). ? You have watery poop (diarrhea). Get help right away if:  Your blood sugar is lower than 54 mg/dL (3 mmol/L).  You get confused.  You have trouble: ? Thinking clearly. ? Breathing.  You have moderate or large ketone levels in your pee (urine). This information is not intended to replace advice given to you by your health care provider. Make sure you discuss any questions you have with your health care provider. Document Released: 08/01/2008 Document Revised: 03/30/2016 Document Reviewed: 11/26/2015 Elsevier Interactive Patient Education  [employer].

## 2017-07-19 NOTE — Addendum Note (Signed)
Addended by: Teddy Spike on: 07/19/2017 12:07 PM   Modules accepted: Orders

## 2017-07-19 NOTE — Progress Notes (Signed)
Subjective:    CC: HTN, IFG   HPI:  Impaired fasting glucose-no increased thirst or urination. No symptoms consistent with hypoglycemia.  Hypertension- Pt denies chest pain, SOB, dizziness, or heart palpitations.  Taking meds as directed w/o problems.  Denies medication side effects.      Past medical history, Surgical history, Family history not pertinant except as noted below, Social history, Allergies, and medications have been entered into the medical record, reviewed, and corrections made.   Review of Systems: No fevers, chills, night sweats, weight loss, chest pain, or shortness of breath.   Objective:    General: Well Developed, well nourished, and in no acute distress.  Neuro: Alert and oriented x3, extra-ocular muscles intact, sensation grossly intact.  HEENT: Normocephalic, atraumatic  Skin: Warm and dry, no rashes. Cardiac: Regular rate and rhythm, no murmurs rubs or gallops, no lower extremity edema.  Respiratory: Clear to auscultation bilaterally. Not using accessory muscles, speaking in full sentences.   Impression and Recommendations:   New diagnosis of diabetes. Discussed new diagnosis. Discussed dietary and exercise measures. She says she used to walk 4 miles a day but quit over the summer. Also discussed getting her in with diabetes and nutrition. We'll also get her her own glucometer. Encouraged her to bring in her glucose log with her when I see her next time. Discussed starting metformin. Discussed potential side effects of medication.  Lab Results  Component Value Date   HGBA1C 7.4 07/19/2017    HTN - Well controlled. Continue current regimen. Follow up in  6 months.   Time spent 30 min, > 50% spent counseling about new dx of diabetes and Hypertension.

## 2017-10-18 ENCOUNTER — Ambulatory Visit: Payer: BLUE CROSS/BLUE SHIELD | Admitting: Family Medicine

## 2017-12-05 ENCOUNTER — Encounter: Payer: Self-pay | Admitting: Osteopathic Medicine

## 2017-12-05 ENCOUNTER — Ambulatory Visit (INDEPENDENT_AMBULATORY_CARE_PROVIDER_SITE_OTHER): Payer: BLUE CROSS/BLUE SHIELD | Admitting: Osteopathic Medicine

## 2017-12-05 VITALS — BP 139/79 | HR 85 | Temp 97.6°F | Resp 16 | Wt 176.9 lb

## 2017-12-05 DIAGNOSIS — I10 Essential (primary) hypertension: Secondary | ICD-10-CM | POA: Diagnosis not present

## 2017-12-05 DIAGNOSIS — Z87440 Personal history of urinary (tract) infections: Secondary | ICD-10-CM | POA: Diagnosis not present

## 2017-12-05 DIAGNOSIS — M545 Low back pain, unspecified: Secondary | ICD-10-CM

## 2017-12-05 DIAGNOSIS — E119 Type 2 diabetes mellitus without complications: Secondary | ICD-10-CM

## 2017-12-05 LAB — POCT URINALYSIS DIP (MANUAL ENTRY)
Bilirubin, UA: NEGATIVE
Blood, UA: NEGATIVE
GLUCOSE UA: NEGATIVE mg/dL
Ketones, POC UA: NEGATIVE mg/dL
Leukocytes, UA: NEGATIVE
Nitrite, UA: NEGATIVE
Protein Ur, POC: NEGATIVE mg/dL
Spec Grav, UA: <=1.005 — AB (ref 1.010–1.025)
UROBILINOGEN UA: 0.2 U/dL
pH, UA: 6 (ref 5.0–8.0)

## 2017-12-05 NOTE — Patient Instructions (Addendum)
Plan:  Will get blood work today and double check urine for infection  Get BP monitor checked here at follow with Dr. Madilyn Fireman  Check sugars fasting AM and anytime you feel ill

## 2017-12-05 NOTE — Progress Notes (Signed)
HPI: Rachel Reed is a 62 y.o. 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  she presents to [facility] today, 12/05/17,  for chief complaint of: High Glc, HTN, back pain   "Metfomin not working" - diet same, low car. Glc 150-174 (highest today). Last A1C 4 mos ago 07/19/17 was 7.4%. Hx IFG at that point but was started on Metformin by PCP then, advised f/u 6 mos.   BP high at home reports 163/93 and HR 100, normal 120/70s. Home BP cuff never verified in the office. Has been on benazepril 10 mg for years. BP ok here, see VS below.    Also waking up a few times in the night recently feeling sweaty, clammy, tingling hands and feet. Not checking Glc at that time.   Low back pain started 4-5 days ago, that's when all these other symptoms started as well (high Glc, high BP). No abd pain. No dysuria or frequency. Reports cloudy urine this AM.  Intentional weight loss on strict diabetic diet and exercise.      Past medical, surgical, social and family history reviewed:  Patient Active Problem List   Diagnosis Date Noted  . H/O splenectomy 06/15/2014  . Obesity (BMI 30-39.9) 06/23/2013  . LIVER FUNCTION TESTS, ABNORMAL, HX OF 01/26/2009  . DYSLIPIDEMIA 09/28/2008  . Controlled type 2 diabetes mellitus without complication, without long-term current use of insulin ([place]) 09/28/2008  . ESSENTIAL HYPERTENSION, BENIGN 09/03/2008  . History of deep vein thrombosis 02/24/2008  . History of gastric ulcer 02/24/2008    Past Surgical History:  Procedure Laterality Date  . ABDOMINAL HYSTERECTOMY  2010   laparoscopic w/ oophorectomy for stage 1 uterine cancer  . APPENDECTOMY    . CESAREAN SECTION     x2  . ESOPHAGOGASTRODUODENOSCOPY   03/01/2007   2 gastric ulcers  . spleenectomy  12/2006   spontaneous rupture    Social History   Tobacco Use  . Smoking status: Never Smoker  . Smokeless tobacco: Never Used  Substance Use Topics  . Alcohol use: No    Family History  Problem Relation Age of Onset  . Breast cancer Mother 22       breast  . Hyperlipidemia Mother   . Hypertension Mother   . Ulcers Father   . Pulmonary embolism Father 59       deceased  . Hypertension Brother   . Diabetes Paternal Grandmother   . Diabetes Paternal Uncle      Current medication list and allergy/intolerance information reviewed:    Current Outpatient Medications  Medication Sig Dispense Refill  . AMBULATORY NON FORMULARY MEDICATION Medication Name: Glucometer, lancets and strips. DX type 2 diabetes E11.9.  Test once a day. 90 days upply 1 Units PRN>  . benazepril (LOTENSIN) 10 MG tablet Take 1 tablet (10 mg total) by mouth daily. 90 tablet 3  . metFORMIN (GLUCOPHAGE XR) 500 MG 24 hr tablet Take 1 tablet (500 mg total) by mouth daily with breakfast. 90 tablet 1   No current facility-administered medications for this visit.     Allergies  Allergen Reactions  . Tramadol Other (See Comments)    Numbness in hands, lips, and throat  . Valtrex [Valacyclovir Hcl] Other (See Comments)    Numbness in her hands, lips, and throat.   Marland Kitchen  Aspirin Other (See Comments)    Hx of recurrent GI ulcer   . Statins Other (See Comments)    Abdominal pain, has tried multiple   . Amoxicillin Rash      Review of Systems:  Constitutional:  No  fever, no chills, No recent illness, No unintentional weight changes. No significant fatigue.   HEENT: No  headache, no vision change  Cardiac: No  chest pain, No  pressure, No palpitation  Respiratory:  No  shortness of breath.   Gastrointestinal: No  abdominal pain, No  nausea, No  vomiting  Musculoskeletal: +LBP, no other new myalgia/arthralgia  Skin: No  Rash  Endocrine: No cold  intolerance,  No heat intolerance. No polyuria/polydipsia/polyphagia   Neurologic: No  weakness, No  dizziness,  Psychiatric: No  concerns with depression, +anxiety, No sleep problems, No mood problems   Exam:  BP 139/79 (BP Location: Right Arm, Patient Position: Sitting, Cuff Size: Large)   Pulse 85   Temp 97.6 F (36.4 C) (Oral)   Resp 16   Wt 176 lb 14.4 oz (80.2 kg)   SpO2 97%   BMI 31.34 kg/m   Constitutional: VS see above. General Appearance: alert, well-developed, well-nourished, NAD  Eyes: Normal lids and conjunctive, non-icteric sclera  Ears, Nose, Mouth, Throat: MMM, Normal external inspection ears/nares/mouth/lips/gums.  Neck: No masses, trachea midline. No thyroid enlargement. No tenderness/mass appreciated. No lymphadenopathy  Respiratory: Normal respiratory effort. no wheeze, no rhonchi, no rales  Cardiovascular: S1/S2 normal, no murmur, no rub/gallop auscultated. RRR. No lower extremity edema.  Musculoskeletal: Gait normal. Mild paraspinal tenderness L4-L5-S1 area bilateral, neg [employer] sign bl  Neurological: Normal balance/coordination. No tremor. No cranial nerve deficit on limited exam. Motor intact and symmetric.  Skin: warm, dry, intact.  Psychiatric: Normal judgment/insight. Anxious mood and affect. Oriented x3.    Results for orders placed or performed in visit on 12/05/17 (from the past 72 hour(s))  POCT urinalysis dipstick     Status: Abnormal   Collection Time: 12/05/17  2:37 PM  Result Value Ref Range   Color, UA light yellow (A) yellow   Clarity, UA cloudy (A) clear   Glucose, UA negative negative mg/dL   Bilirubin, UA negative negative   Ketones, POC UA negative negative mg/dL   Spec Grav, UA <=1.005 (A) 1.010 - 1.025   Blood, UA negative negative   pH, UA 6.0 5.0 - 8.0   Protein Ur, POC negative negative mg/dL   Urobilinogen, UA 0.2 0.2 or 1.0 E.U./dL   Nitrite, UA Negative Negative   Leukocytes, UA Negative Negative       ASSESSMENT/PLAN: The primary encounter diagnosis was Controlled type 2 diabetes mellitus without complication, without long-term current use of insulin ([place]). Diagnoses of History of recurrent UTIs, Acute low back pain without sciatica, unspecified back pain laterality, and Essential hypertension, benign were also pertinent to this visit.  Possible night-time hypoglycemia and AM hyperglycemia? Intentional weight loss and diet changes. Anxiety-induced HTN? Labs as below. See pt instructions.   Controlled type 2 diabetes mellitus without complication, without long-term current use of insulin ([facility]) - low-dose metformin XR continue for now  - Plan: Hemoglobin A1c  History of recurrent UTIs - Plan: POCT urinalysis dipstick, Urine Culture  Acute low back pain without sciatica, unspecified back pain laterality - home stretched advised, addres further at f/u if persist - Plan: POCT urinalysis dipstick, Urine Culture  Essential hypertension, benign - Plan: CBC, COMPLETE METABOLIC PANEL WITH GFR, TSH    Patient Instructions  Plan:  Will get blood work today and double check urine for infection  Get BP monitor checked here at follow with Dr. Madilyn Fireman  Check sugars fasting AM and anytime you feel ill       Visit summary with medication list and pertinent instructions was printed for patient to review. All questions at time of visit were answered - patient instructed to contact office with any additional concerns. ER/RTC precautions were reviewed with the patient.   Follow-up plan: Return for recheck with Dr. Madilyn Fireman in next 1-2 weeks.  Note: Total time spent 25 minutes, greater than 50% of the visit was spent face-to-face counseling and coordinating care for the following: The primary encounter diagnosis was Controlled type 2 diabetes mellitus without complication, without long-term current use of insulin ([place]). Diagnoses of History of recurrent UTIs, Acute low back pain without  sciatica, unspecified back pain laterality, and Essential hypertension, benign were also pertinent to this visit.Marland Kitchen  Please note: voice recognition software was used to produce this document, and typos may escape review. Please contact Dr. Sheppard Coil for any needed clarifications.

## 2017-12-06 LAB — COMPLETE METABOLIC PANEL WITH GFR
AG Ratio: 1.3 (calc) (ref 1.0–2.5)
ALT: 19 U/L (ref 6–29)
AST: 18 U/L (ref 10–35)
Albumin: 4.7 g/dL (ref 3.6–5.1)
Alkaline phosphatase (APISO): 86 U/L (ref 33–130)
BUN: 12 mg/dL (ref 7–25)
CALCIUM: 10.4 mg/dL (ref 8.6–10.4)
CO2: 25 mmol/L (ref 20–32)
CREATININE: 0.63 mg/dL (ref 0.50–0.99)
Chloride: 102 mmol/L (ref 98–110)
GFR, EST NON AFRICAN AMERICAN: 95 mL/min/{1.73_m2} (ref >=60)
GFR, Est African American: 111 mL/min/{1.73_m2} (ref >=60)
GLOBULIN: 3.7 g/dL (ref 1.9–3.7)
Glucose, Bld: 93 mg/dL (ref 65–99)
Potassium: 5.1 mmol/L (ref 3.5–5.3)
SODIUM: 136 mmol/L (ref 135–146)
TOTAL PROTEIN: 8.4 g/dL — AB (ref 6.1–8.1)
Total Bilirubin: 0.6 mg/dL (ref 0.2–1.2)

## 2017-12-06 LAB — CBC
HEMATOCRIT: 47.3 % — AB (ref 35.0–45.0)
Hemoglobin: 16 g/dL — ABNORMAL HIGH (ref 11.7–15.5)
MCH: 28.8 pg (ref 27.0–33.0)
MCHC: 33.8 g/dL (ref 32.0–36.0)
MCV: 85.2 fL (ref 80.0–100.0)
MPV: 11.6 fL (ref 7.5–12.5)
Platelets: 376 10*3/uL (ref 140–400)
RBC: 5.55 10*6/uL — ABNORMAL HIGH (ref 3.80–5.10)
RDW: 12.8 % (ref 11.0–15.0)
WBC: 9.8 10*3/uL (ref 3.8–10.8)

## 2017-12-06 LAB — URINE CULTURE
MICRO NUMBER:: 90128571
SPECIMEN QUALITY: ADEQUATE

## 2017-12-06 LAB — HEMOGLOBIN A1C
Hgb A1c MFr Bld: 6.5 % of total Hgb — ABNORMAL HIGH (ref <5.7)
MEAN PLASMA GLUCOSE: 140 (calc)
eAG (mmol/L): 7.7 (calc)

## 2017-12-06 LAB — TSH: TSH: 2.02 mIU/L (ref 0.40–4.50)

## 2017-12-12 ENCOUNTER — Ambulatory Visit (INDEPENDENT_AMBULATORY_CARE_PROVIDER_SITE_OTHER): Payer: BLUE CROSS/BLUE SHIELD | Admitting: Osteopathic Medicine

## 2017-12-12 ENCOUNTER — Encounter: Payer: Self-pay | Admitting: Osteopathic Medicine

## 2017-12-12 VITALS — BP 107/70 | HR 73 | Temp 97.6°F | Wt 174.1 lb

## 2017-12-12 DIAGNOSIS — M545 Low back pain, unspecified: Secondary | ICD-10-CM

## 2017-12-12 DIAGNOSIS — R110 Nausea: Secondary | ICD-10-CM

## 2017-12-12 DIAGNOSIS — E119 Type 2 diabetes mellitus without complications: Secondary | ICD-10-CM

## 2017-12-12 DIAGNOSIS — R300 Dysuria: Secondary | ICD-10-CM

## 2017-12-12 LAB — POCT URINALYSIS DIPSTICK
Bilirubin, UA: NEGATIVE
Blood, UA: NEGATIVE
Glucose, UA: NEGATIVE
LEUKOCYTES UA: NEGATIVE
NITRITE UA: NEGATIVE
PH UA: 7.5 (ref 5.0–8.0)
SPEC GRAV UA: 1.015 (ref 1.010–1.025)
UROBILINOGEN UA: 0.2 U/dL

## 2017-12-12 MED ORDER — CIPROFLOXACIN HCL 500 MG PO TABS: 500.0000 mg | ORAL_TABLET | Freq: Two times a day (BID) | ORAL | 0 refills | Status: AC

## 2017-12-12 MED ORDER — ONDANSETRON 8 MG PO TBDP: 8.0000 mg | ORAL_TABLET | Freq: Three times a day (TID) | ORAL | 1 refills | Status: AC | PRN

## 2017-12-12 NOTE — Progress Notes (Signed)
HPI: Rachel Reed is a 62 y.o. 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  she presents to [facility] today, 12/12/17,  for chief complaint of: Not feeling better from last visit  Here today c/o frequent urination and dysuria, new starting this weekend, was up several times going to the bathroom last night, lower back pain has persistent. Nausea. Feeling weak.   Last seen by myself for acute visit 12/05/17 (7 days ago), concerned for elevated Glc levels and possible UTI given low back pain w/o other symptoms. A1C was fine at that visit. She also reported   high BP at home, no home BP cuff verified. BP was fine in the office. Asked to bring her home cuff to next visit  Episodes of waking up feeling sweaty, clammy, tingling hands and feet.Intentional weight loss and strict diet/exercise, we thought might be Glc dropping in the night and being a bit higher in the AM. She was adivsed to check Glc if/when this happened again.   LBP x4-5 days, no dysuria/frequency. UA in office ok, advised defer UTI tx unless (+)Cx.  UA last visit neg leuks, neg nitrites and Ucx showed multiple orgs none predominant.       Past medical, surgical, social and family history reviewed:  Patient Active Problem List   Diagnosis Date Noted  . H/O splenectomy 06/15/2014  . Obesity (BMI 30-39.9) 06/23/2013  . LIVER FUNCTION TESTS, ABNORMAL, HX OF 01/26/2009  . DYSLIPIDEMIA 09/28/2008  . Controlled type 2 diabetes mellitus without complication, without long-term current use of insulin ([place]) 09/28/2008  . ESSENTIAL HYPERTENSION, BENIGN 09/03/2008  . History of deep vein thrombosis 02/24/2008  . History of gastric ulcer 02/24/2008    Past Surgical History:   Procedure Laterality Date  . ABDOMINAL HYSTERECTOMY  2010   laparoscopic w/ oophorectomy for stage 1 uterine cancer  . APPENDECTOMY    . CESAREAN SECTION     x2  . ESOPHAGOGASTRODUODENOSCOPY  03/01/2007   2 gastric ulcers  . spleenectomy  12/2006   spontaneous rupture    Social History   Tobacco Use  . Smoking status: Never Smoker  . Smokeless tobacco: Never Used  Substance Use Topics  . Alcohol use: No    Family History  Problem Relation Age of Onset  . Breast cancer Mother 79       breast  . Hyperlipidemia Mother   . Hypertension Mother   . Ulcers Father   . Pulmonary embolism Father 87       deceased  . Hypertension Brother   . Diabetes Paternal Grandmother   . Diabetes Paternal Uncle      Current medication list and allergy/intolerance information reviewed:    Current Outpatient Medications  Medication Sig Dispense Refill  . AMBULATORY NON FORMULARY MEDICATION Medication Name: Glucometer, lancets and strips. DX type 2 diabetes E11.9.  Test once a day. 90 days upply 1 Units PRN>  . benazepril (LOTENSIN) 10 MG tablet Take 1 tablet (10 mg total) by mouth daily. 90 tablet 3  . metFORMIN (GLUCOPHAGE XR) 500 MG 24 hr tablet Take 1 tablet (500 mg total) by mouth daily with breakfast. 90 tablet 1   No current facility-administered medications for this visit.     Allergies  Allergen Reactions  . Tramadol Other (See Comments)  Numbness in hands, lips, and throat  . Valtrex [Valacyclovir Hcl] Other (See Comments)    Numbness in her hands, lips, and throat.   . Aspirin Other (See Comments)    Hx of recurrent GI ulcer   . Statins Other (See Comments)    Abdominal pain, has tried multiple   . Amoxicillin Rash      Review of Systems:  Constitutional:  No  fever, no chills, No unintentional weight changes. +significant fatigue.   HEENT: No  headache, no vision change  Cardiac: No  chest pain, No  pressure, No palpitations, No  Orthopnea  Respiratory:  No   shortness of breath. No  Cough  Gastrointestinal: +suprapubic abdominal pain, +nausea, No  vomiting,  No  blood in stool, No  diarrhea, No  constipation   Musculoskeletal: No new myalgia/arthralgia, +LBP  Skin: No  Rash  Genitourinary: No  incontinence, No  abnormal genital bleeding, No abnormal genital discharge     Exam:  BP 107/70 (BP Location: Right Arm)   Pulse 73   Temp 97.6 F (36.4 C) (Oral)   Wt 174 lb 1.9 oz (79 kg)   BMI 30.84 kg/m  173/82 on intake   Constitutional: VS see above. General Appearance: alert, well-developed, well-nourished, NAD  Eyes: Normal lids and conjunctive, non-icteric sclera  Ears, Nose, Mouth, Throat: MMM, Normal external inspection ears/nares/mouth/lips/gums.   Neck: No masses, trachea midline.   Respiratory: Normal respiratory effort. no wheeze, no rhonchi, no rales  Cardiovascular: S1/S2 normal, no murmur, no rub/gallop auscultated. RRR.   Gastrointestinal: Reports epigastric and suprapubic tenderness w/o rebound/guarding, no masses. No hepatomegaly, no splenomegaly. No hernia appreciated. Bowel sounds normal. Rectal exam deferred.   Musculoskeletal: Gait normal. No clubbing/cyanosis of digits. Neg costovertebral tenderness  Neurological: Normal balance/coordination. No tremor.  Skin: warm, dry, intact.   Psychiatric: Normal judgment/insight. Anxious mood and affect. Oriented x3.    Recent Results (from the past 2160 hour(s))  CBC     Status: Abnormal   Collection Time: 12/05/17  2:36 PM  Result Value Ref Range   WBC 9.8 3.8 - 10.8 Thousand/uL   RBC 5.55 (H) 3.80 - 5.10 Million/uL   Hemoglobin 16.0 (H) 11.7 - 15.5 g/dL   HCT 47.3 (H) 35.0 - 45.0 %   MCV 85.2 80.0 - 100.0 fL   MCH 28.8 27.0 - 33.0 pg   MCHC 33.8 32.0 - 36.0 g/dL   RDW 12.8 11.0 - 15.0 %   Platelets 376 140 - 400 Thousand/uL   MPV 11.6 7.5 - 12.5 fL  COMPLETE METABOLIC PANEL WITH GFR     Status: Abnormal   Collection Time: 12/05/17  2:36 PM  Result Value  Ref Range   Glucose, Bld 93 65 - 99 mg/dL        BUN 12 7 - 25 mg/dL   Creat 0.63 0.50 - 0.99 mg/dL        GFR, Est Non African American 95 > OR = 60 mL/min/1.54m2   GFR, Est African American 111 > OR = 60 mL/min/1.67m2   BUN/Creatinine Ratio NOT APPLICABLE 6 - 22 (calc)   Sodium 136 135 - 146 mmol/L   Potassium 5.1 3.5 - 5.3 mmol/L   Chloride 102 98 - 110 mmol/L   CO2 25 20 - 32 mmol/L   Calcium 10.4 8.6 - 10.4 mg/dL   Total Protein 8.4 (H) 6.1 - 8.1 g/dL   Albumin 4.7 3.6 - 5.1 g/dL   Globulin 3.7 1.9 - 3.7 g/dL (calc)  AG Ratio 1.3 1.0 - 2.5 (calc)   Total Bilirubin 0.6 0.2 - 1.2 mg/dL   Alkaline phosphatase (APISO) 86 33 - 130 U/L   AST 18 10 - 35 U/L   ALT 19 6 - 29 U/L  TSH     Status: None   Collection Time: 12/05/17  2:36 PM  Result Value Ref Range   TSH 2.02 0.40 - 4.50 mIU/L  Hemoglobin A1c     Status: Abnormal   Collection Time: 12/05/17  2:36 PM  Result Value Ref Range   Hgb A1c MFr Bld 6.5 (H) <5.7 % of total Hgb        Mean Plasma Glucose 140 (calc)   eAG (mmol/L) 7.7 (calc)  POCT urinalysis dipstick     Status: Abnormal   Collection Time: 12/05/17  2:37 PM  Result Value Ref Range   Color, UA light yellow (A) yellow   Clarity, UA cloudy (A) clear   Glucose, UA negative negative mg/dL   Bilirubin, UA negative negative   Ketones, POC UA negative negative mg/dL   Spec Grav, UA <=1.005 (A) 1.010 - 1.025   Blood, UA negative negative   pH, UA 6.0 5.0 - 8.0   Protein Ur, POC negative negative mg/dL   Urobilinogen, UA 0.2 0.2 or 1.0 E.U./dL   Nitrite, UA Negative Negative   Leukocytes, UA Negative Negative  Urine Culture     Status: None   Collection Time: 12/05/17  2:40 PM  Result Value Ref Range   MICRO NUMBER: [id number]    SPECIMEN QUALITY: ADEQUATE    Sample Source URINE    STATUS: FINAL    ISOLATE 1:      Multiple organisms present, each less than 10,000 CFU/mL. These organisms, commonly found on external and internal genitalia, are considered to be  colonizers. No further testing performed.    Results for orders placed or performed in visit on 12/12/17 (from the past 24 hour(s))  POCT Urinalysis Dipstick     Status: None   Collection Time: 12/12/17  9:05 AM  Result Value Ref Range   Color, UA YELLOW    Clarity, UA CLEAR    Glucose, UA NEGATIVE    Bilirubin, UA NEGATIVE    Ketones, UA TRACE    Spec Grav, UA 1.015 1.010 - 1.025   Blood, UA NEGATIVE    pH, UA 7.5 5.0 - 8.0   Protein, UA 30 MG/DL    Urobilinogen, UA 0.2 0.2 or 1.0 E.U./dL   Nitrite, UA NEGATIVE    Leukocytes, UA Negative Negative   Appearance     Odor        ASSESSMENT/PLAN:   Dysuria - Plan: ondansetron (ZOFRAN-ODT) 8 MG disintegrating tablet, Urine Culture  Acute low back pain without sciatica, unspecified back pain laterality - Plan: POCT Urinalysis Dipstick  Controlled type 2 diabetes mellitus without complication, without long-term current use of insulin ([facility])  Nausea - Plan: ciprofloxacin (CIPRO) 500 MG tablet    Patient Instructions  Plan:  Will treat for UTI based on symptoms, will repeat culture to confirm which bacteria may be causing the problem, and if we need to change antibiotics (if what grows is resistant to Cipro) will call you even over the weekend - culture should take 2-3 days to result  I sent in nausea medications as well  Please rest, stay hydrated!  It is reassuring that your blood pressure is ok, heart rate is not too fast, no fever, and back pain is not  in the area of the kidneys. If you feel fever or persistent racing heart rate, worsening abdominal or back pain, please go to an emergency room!    Pt coming back to lab to give another urine specimen for culture prior to starting abx.    Visit summary with medication list and pertinent instructions was printed for patient to review. All questions at time of visit were answered - patient instructed to contact office with any additional concerns. ER/RTC precautions were  reviewed with the patient.   Follow-up plan: Return if symptoms worsen or fail to improve, and recheck in one week with Dr. Madilyn Fireman .  Note: Total time spent 25 minutes, greater than 50% of the visit was spent face-to-face counseling and coordinating care for the following: The primary encounter diagnosis was Dysuria. Diagnoses of Acute low back pain without sciatica, unspecified back pain laterality, Controlled type 2 diabetes mellitus without complication, without long-term current use of insulin ([place]), and Nausea were also pertinent to this visit.Marland Kitchen  Please note: voice recognition software was used to produce this document, and typos may escape review. Please contact Dr. Sheppard Coil for any needed clarifications.

## 2017-12-12 NOTE — Patient Instructions (Signed)
Plan:  Will treat for UTI based on symptoms, will repeat culture to confirm which bacteria may be causing the problem, and if we need to change antibiotics (if what grows is resistant to Cipro) will call you even over the weekend - culture should take 2-3 days to result  I sent in nausea medications as well  Please rest, stay hydrated!  It is reassuring that your blood pressure is ok, heart rate is not too fast, no fever, and back pain is not in the area of the kidneys. If you feel fever or persistent racing heart rate, worsening abdominal or back pain, please go to an emergency room!

## 2017-12-20 ENCOUNTER — Ambulatory Visit: Payer: BLUE CROSS/BLUE SHIELD | Admitting: Family Medicine
